# Patient Record
Sex: Female | Born: 1995 | Race: White | Hispanic: No | Marital: Single | Smoking: Never smoker
Health system: Southern US, Community
[De-identification: ages and names within clinical notes are randomized; demographics above are authoritative.]

## PROBLEM LIST (undated history)

## (undated) DIAGNOSIS — Z8619 Personal history of other infectious and parasitic diseases: Secondary | ICD-10-CM

## (undated) DIAGNOSIS — R519 Headache, unspecified: Secondary | ICD-10-CM

## (undated) DIAGNOSIS — Z8669 Personal history of other diseases of the nervous system and sense organs: Secondary | ICD-10-CM

## (undated) DIAGNOSIS — Z8781 Personal history of (healed) traumatic fracture: Secondary | ICD-10-CM

## (undated) DIAGNOSIS — Z111 Encounter for screening for respiratory tuberculosis: Secondary | ICD-10-CM

## (undated) DIAGNOSIS — R51 Headache: Secondary | ICD-10-CM

## (undated) HISTORY — DX: Personal history of (healed) traumatic fracture: Z87.81

## (undated) HISTORY — PX: TYMPANOSTOMY TUBE PLACEMENT: SHX32

## (undated) HISTORY — DX: Personal history of other diseases of the nervous system and sense organs: Z86.69

## (undated) HISTORY — DX: Personal history of other infectious and parasitic diseases: Z86.19

## (undated) HISTORY — DX: Encounter for screening for respiratory tuberculosis: Z11.1

## (undated) HISTORY — PX: WISDOM TOOTH EXTRACTION: SHX21

---
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
## 2016-03-23 ENCOUNTER — Telehealth: Payer: Self-pay | Admitting: *Deleted

## 2016-03-23 NOTE — Telephone Encounter (Signed)
Patient's mother called stating the pt is having dull headaches for the past month or so?  Mother states she feels it is a lack of rest and being on her phone a lot.  I talked with [facility] and informed her of this and she approved to schedule the pt an appt for 6/30 at 3pm.

## 2016-03-27 ENCOUNTER — Ambulatory Visit (INDEPENDENT_AMBULATORY_CARE_PROVIDER_SITE_OTHER): Payer: BLUE CROSS/BLUE SHIELD | Admitting: Internal Medicine

## 2016-03-27 ENCOUNTER — Encounter: Payer: Self-pay | Admitting: Internal Medicine

## 2016-03-27 VITALS — BP 120/80 | Temp 98.4°F | Wt 179.7 lb

## 2016-03-27 DIAGNOSIS — G4452 New daily persistent headache (NDPH): Secondary | ICD-10-CM | POA: Insufficient documentation

## 2016-03-27 DIAGNOSIS — J029 Acute pharyngitis, unspecified: Secondary | ICD-10-CM

## 2016-03-27 DIAGNOSIS — G43009 Migraine without aura, not intractable, without status migrainosus: Secondary | ICD-10-CM | POA: Diagnosis not present

## 2016-03-27 MED ORDER — AMITRIPTYLINE HCL 10 MG PO TABS: 10.0000 mg | ORAL_TABLET | Freq: Every day | ORAL | Status: DC

## 2016-03-27 MED ORDER — SUMATRIPTAN SUCCINATE 100 MG PO TABS: ORAL_TABLET | ORAL | Status: DC

## 2016-03-27 NOTE — Progress Notes (Signed)
Chief Complaint  Patient presents with  . Migraine    Wakes with a headache everyday.  Gets a migraine about 3 x months with vomiting, blurred vision and floaters.  . Headache    HPI: Katelyn Alvarado 20 y.o. comes in today because of continued worsening and persistence of headaches. She states that she has every day.Onset  In am   Dull and then worse  And gets floaters and then take ibu every day and as needed  Migraine.  Tried other options .  She states that she takes Effexor 600 mg of ibuprofen midday or afternoon to help with the headaches. Drink sweet tea most days. She will take over-the-counter Excedrin Migraine. When needed. The headaches often have photophobia but no vomiting. The migraines are more severe where she needs to be in a dark room. Rest helps the daily headaches but they progress as the day goes on. Vision :   No double vision . Sleeping  And in shower.   Migraines 2 x per month  righ eye  Sharp and blurry light and dark isssues  ROS: See pertinent positives and negatives per HPI. For the last 3 days has had a sore throat went to urgent care rapid strep negative thoracic culture is pending. No fever but does have white spots on her tonsils.  Past Medical History  Diagnosis Date  . Hx of varicella     titer confirmed   . Hx of fracture of arm     left   . History of ear infection   . PPD screening test     negative     Family History  Problem Relation Age of Onset  . Hypertension    . Asthma Sister     Social History   Social History  . Marital Status: Single    Spouse Name: N/A  . Number of Children: N/A  . Years of Education: N/A   Social History Main Topics  . Smoking status: Never Smoker   . Smokeless tobacco: Never Used  . Alcohol Use: No  . Drug Use: None  . Sexual Activity: Not Asked   Other Topics Concern  . None   Social History Narrative   Neg tad  See   [facility] under centricity    In  [employer] program to do clinical     No outpatient  prescriptions prior to visit.   No facility-administered medications prior to visit.     EXAM:  BP 120/80 mmHg  Temp(Src) 98.4 F (36.9 C) (Oral)  Wt 179 lb 11.2 oz (81.511 kg)  Body mass index is 30.83 kg/(m^2).  GENERAL: vitals reviewed and listed above, alert, oriented, appears well hydrated and in no acute distress HEENT: atraumatic, conjunctiva  clear, no obvious abnormalities on inspection of external nose and ears OP : no lesion edema Tonsils +2 mildly red with pinpoint exudates bilaterally. Neck supple with minimal adenopathy. NECK: no obvious masses on inspection palpation  LUNGS: clear to auscultation bilaterally, no wheezes, rales or rhonchi, good air movement CV: HRRR, no clubbing cyanosis or  peripheral edema nl cap refill  MS: moves all extremities without noticeable focal  Abnormality NEURO: oriented x 3 CN 3-12 appear intact. No focal muscle weakness or atrophy. DTRs symmetrical. Gait WNL.  Grossly non focal. No tremor or abnormal movement. PSYCH: pleasant and cooperative, no obvious depression or anxiety Initial blood pressure repeated and down to 120/80. BP Readings from Last 3 Encounters:  03/27/16 120/80  05/24/15 120/82  02/08/15 108/72   Wt Readings from Last 3 Encounters:  03/27/16 179 lb 11.2 oz (81.511 kg) (95 %*, Z = 1.62)  05/24/15 171 lb (77.565 kg) (93 %*, Z = 1.50)  02/08/15 166 lb 3.2 oz (75.388 kg) (92 %*, Z = 1.42)   * Growth percentiles are based on CDC 2-20 Years data.    ASSESSMENT AND PLAN:  Discussed the following assessment and plan:  New persistent daily headache - Plan: AMB referral to headache clinic  Migraine without aura and without status migrainosus, not intractable - Plan: AMB referral to headache clinic  Exudative pharyngitis - Under evaluation urgent care strep cxt pending.  -Patient advised to return or notify health care team  if symptoms worsen ,persist or new concerns arise.  Patient Instructions  I want to refer  you to headache specialist Neurologist .    you will be contacted about this .  Limit caffiene  .  Can try Imitrex for the acute migraine  .  There are controller medications such as Topamax or elavil for control of headaches    Daily medications can cause rebound headaches   And you should limit otc dialy medications unless specialist rpescribes this.   Analgesic Rebound Headaches An analgesic rebound headache is a headache that returns after pain medicine (analgesic) that was taken to treat the initial headache wears off. People who suffer from tension, migraine, or cluster headaches are at risk for developing rebound headaches. Any type of primary headache can return as a rebound headache if you regularly take analgesics more than three times a week. If the cycle of rebound headaches continues, they become chronic daily headaches.  CAUSES Analgesics frequently associated with this problem include common over-the-counter medicines like aspirin, ibuprofen, acetaminophen, sinus relief medicines, and other medicines that contain caffeine. Narcotic pain medicines are also a common cause of rebound headaches.  SIGNS AND SYMPTOMS The symptoms of rebound headaches are the same as the symptoms of your initial headache. Symptoms of specific types of headaches include: Tension headache  Pressure around the head.  Dull, aching head pain.  Pain felt over the front and sides of the head.  Tenderness in the muscles of the head, neck and shoulders. Migraine Headache  Pulsing or throbbing pain on one or both sides of the head.  Severe pain that interferes with daily activities.  Pain that is worsened by physical activity.  Nausea, vomiting, or both.  Pain with exposure to bright light, loud noises, or strong smells.  General sensitivity to bright light, loud noises, or strong smells.  Visual changes.  Numbness of one or both arms. Cluster Headaches  Severe pain that begins in or around  one eye or temple.  Redness in the eye on the same side as the pain.  Droopy or swollen eyelid.  One-sided head pain.  Nausea.  Runny nose.  Sweaty, pale facial skin.  Restlessness. DIAGNOSIS  Analgesic rebound headaches are diagnosed by reviewing your medical history. This includes the nature of your initial headaches, as well as the type of pain medicines you have been using to treat your headaches and how often you take them. TREATMENT Discontinuing frequent use of the analgesic medicine will typically reduce the frequency of the rebound episodes. This may initially worsen your headaches but eventually the pain should become more manageable, less frequent, and less severe.  Seeing a headache specialists may helpful. He or she may be able to help you manage your headaches and to make sure there  is not another cause of the headaches. Alternative methods of stress relief such as acupuncture, counseling, biofeedback, and massage may also be helpful. Talk with your health care provider about which alternative treatments might be good for you. HOME CARE INSTRUCTIONS Stopping the regular use of pain medicine can be difficult. Follow your health care provider's instructions carefully. Keep all of your appointments. Avoid triggers that are known to cause your primary headaches. SEEK MEDICAL CARE IF: You continue to experience headaches after following your health care provider's recommended treatments. SEEK IMMEDIATE MEDICAL CARE IF:  You develop new headache pain.  You develop headache pain that is different than what you have experienced in the past.  You develop numbness or tingling in your arms or legs.  You develop changes in your speech or vision. MAKE SURE YOU:  Understand these instructions.  Will watch your child's condition.  Will get help right away if your child is not doing well or gets worse.   This information is not intended to replace advice given to you by your  health care provider. Make sure you discuss any questions you have with your health care provider.   Document Released: 12/05/2003 Document Revised: 10/05/2014 Document Reviewed: 03/30/2013 Elsevier Interactive Patient Education 2016 [place] [employer]. [employer] Cowden M.D.

## 2016-03-27 NOTE — Assessment & Plan Note (Signed)
Occurs apparently 2-3 times a month with photophobia and phonophobia and nausea. Can try Imitrex for these. Headache referral. No alarm findings otherwise.

## 2016-03-27 NOTE — Assessment & Plan Note (Signed)
Concern for progressive headache although worse in midday afternoon consider analgesic rebound headache. Refer to headache neurology. In the meantime can add low-dose amitriptyline as a trial.

## 2016-03-27 NOTE — Patient Instructions (Addendum)
I want to refer you to headache specialist Neurologist .    you will be contacted about this .  Limit caffiene  .  Can try Imitrex for the acute migraine  .  There are controller medications such as Topamax or elavil for control of headaches    Daily medications can cause rebound headaches   And you should limit otc dialy medications unless specialist rpescribes this.   Analgesic Rebound Headaches An analgesic rebound headache is a headache that returns after pain medicine (analgesic) that was taken to treat the initial headache wears off. People who suffer from tension, migraine, or cluster headaches are at risk for developing rebound headaches. Any type of primary headache can return as a rebound headache if you regularly take analgesics more than three times a week. If the cycle of rebound headaches continues, they become chronic daily headaches.  CAUSES Analgesics frequently associated with this problem include common over-the-counter medicines like aspirin, ibuprofen, acetaminophen, sinus relief medicines, and other medicines that contain caffeine. Narcotic pain medicines are also a common cause of rebound headaches.  SIGNS AND SYMPTOMS The symptoms of rebound headaches are the same as the symptoms of your initial headache. Symptoms of specific types of headaches include: Tension headache  Pressure around the head.  Dull, aching head pain.  Pain felt over the front and sides of the head.  Tenderness in the muscles of the head, neck and shoulders. Migraine Headache  Pulsing or throbbing pain on one or both sides of the head.  Severe pain that interferes with daily activities.  Pain that is worsened by physical activity.  Nausea, vomiting, or both.  Pain with exposure to bright light, loud noises, or strong smells.  General sensitivity to bright light, loud noises, or strong smells.  Visual changes.  Numbness of one or both arms. Cluster Headaches  Severe pain that  begins in or around one eye or temple.  Redness in the eye on the same side as the pain.  Droopy or swollen eyelid.  One-sided head pain.  Nausea.  Runny nose.  Sweaty, pale facial skin.  Restlessness. DIAGNOSIS  Analgesic rebound headaches are diagnosed by reviewing your medical history. This includes the nature of your initial headaches, as well as the type of pain medicines you have been using to treat your headaches and how often you take them. TREATMENT Discontinuing frequent use of the analgesic medicine will typically reduce the frequency of the rebound episodes. This may initially worsen your headaches but eventually the pain should become more manageable, less frequent, and less severe.  Seeing a headache specialists may helpful. He or she may be able to help you manage your headaches and to make sure there is not another cause of the headaches. Alternative methods of stress relief such as acupuncture, counseling, biofeedback, and massage may also be helpful. Talk with your health care provider about which alternative treatments might be good for you. HOME CARE INSTRUCTIONS Stopping the regular use of pain medicine can be difficult. Follow your health care provider's instructions carefully. Keep all of your appointments. Avoid triggers that are known to cause your primary headaches. SEEK MEDICAL CARE IF: You continue to experience headaches after following your health care provider's recommended treatments. SEEK IMMEDIATE MEDICAL CARE IF:  You develop new headache pain.  You develop headache pain that is different than what you have experienced in the past.  You develop numbness or tingling in your arms or legs.  You develop changes in your speech or  vision. MAKE SURE YOU:  Understand these instructions.  Will watch your child's condition.  Will get help right away if your child is not doing well or gets worse.   This information is not intended to replace advice  given to you by your health care provider. Make sure you discuss any questions you have with your health care provider.   Document Released: 12/05/2003 Document Revised: 10/05/2014 Document Reviewed: 03/30/2013 Elsevier Interactive Patient Education [employer].

## 2016-03-27 NOTE — Progress Notes (Signed)
Pre visit review using our clinic review tool, if applicable. No additional management support is needed unless otherwise documented below in the visit note. 

## 2016-03-30 ENCOUNTER — Encounter: Payer: Self-pay | Admitting: Internal Medicine

## 2016-03-30 ENCOUNTER — Ambulatory Visit (INDEPENDENT_AMBULATORY_CARE_PROVIDER_SITE_OTHER): Payer: BLUE CROSS/BLUE SHIELD | Admitting: Internal Medicine

## 2016-03-30 VITALS — BP 122/82 | Temp 98.3°F | Wt 180.5 lb

## 2016-03-30 DIAGNOSIS — J029 Acute pharyngitis, unspecified: Secondary | ICD-10-CM

## 2016-03-30 LAB — POCT MONO (EPSTEIN BARR VIRUS): Mono, POC: NEGATIVE

## 2016-03-30 LAB — CBC WITH DIFFERENTIAL/PLATELET
BASOS ABS: 0.1 10*3/uL (ref 0.0–0.1)
BASOS PCT: 0.4 % (ref 0.0–3.0)
EOS PCT: 0.5 % (ref 0.0–5.0)
Eosinophils Absolute: 0.1 10*3/uL (ref 0.0–0.7)
HEMATOCRIT: 40.9 % (ref 36.0–49.0)
Hemoglobin: 13.6 g/dL (ref 12.0–16.0)
LYMPHS PCT: 15.9 % — AB (ref 24.0–48.0)
Lymphs Abs: 2 10*3/uL (ref 0.7–4.0)
MCHC: 33.4 g/dL (ref 31.0–37.0)
MCV: 84.5 fl (ref 78.0–98.0)
MONOS PCT: 5.9 % (ref 3.0–12.0)
Monocytes Absolute: 0.8 10*3/uL (ref 0.1–1.0)
NEUTROS ABS: 9.8 10*3/uL — AB (ref 1.4–7.7)
Neutrophils Relative %: 77.3 % — ABNORMAL HIGH (ref 43.0–71.0)
PLATELETS: 299 10*3/uL (ref 150.0–575.0)
RBC: 4.84 Mil/uL (ref 3.80–5.70)
RDW: 13 % (ref 11.4–15.5)
WBC: 12.7 10*3/uL (ref 4.5–13.5)

## 2016-03-30 MED ORDER — CEPHALEXIN 500 MG PO CAPS: 500.0000 mg | ORAL_CAPSULE | Freq: Two times a day (BID) | ORAL | Status: DC

## 2016-03-30 NOTE — Progress Notes (Signed)
Chief Complaint  Patient presents with  . Sore Throat    Worsening    HPI: Katelyn Alvarado 20 y.o.   1 week of sx .  Noes stuffy   No cough fever  Getting worse throat feels closing .  rs and cultrure reported as negative for strep.  Hurting a bit  Now on ibuprofen for this no rash hurts to swallow No vomiting    ROS: See pertinent positives and negatives per HPI.  Past Medical History  Diagnosis Date  . Hx of varicella     titer confirmed   . Hx of fracture of arm     left   . History of ear infection   . PPD screening test     negative     Family History  Problem Relation Age of Onset  . Hypertension    . Asthma Sister     Social History   Social History  . Marital Status: Single    Spouse Name: N/A  . Number of Children: N/A  . Years of Education: N/A   Social History Main Topics  . Smoking status: Never Smoker   . Smokeless tobacco: Never Used  . Alcohol Use: No  . Drug Use: None  . Sexual Activity: Not Asked   Other Topics Concern  . None   Social History Narrative   Neg tad  See   [facility] under centricity    In  [employer] program to do clinical     Outpatient Prescriptions Prior to Visit  Medication Sig Dispense Refill  . amitriptyline (ELAVIL) 10 MG tablet Take 1 tablet (10 mg total) by mouth at bedtime. may increase to 20 mg at night after 1 week for headache suppression. 60 tablet 0  . SUMAtriptan (IMITREX) 100 MG tablet Take 1 at onset of migraine may repeat in 2 hours    No more than 4 per week . 9 tablet 1  . aspirin-acetaminophen-caffeine (EXCEDRIN MIGRAINE) [id number] MG tablet Take by mouth every 6 (six) hours as needed for headache.    . IBUPROFEN PO Take by mouth.     No facility-administered medications prior to visit.     EXAM:  BP 122/82 mmHg  Temp(Src) 98.3 F (36.8 C) (Oral)  Wt 180 lb 8 oz (81.874 kg)  Body mass index is 30.97 kg/(m^2).  GENERAL: vitals reviewed and listed above, alert, oriented, appears well hydrated and in  no acute distress speech slightly mulffled  HEENT: atraumatic, conjunctiva  clear, no obvious abnormalities on inspection of external nose and ears OP : tonsil 2=3+  esucate confluent left  NECK: no obvious masses on inspection tender ac nodes shoddy pc nodes  LUNGS: clear to auscultation bilaterally, no wheezes, rales or rhonchi, good air movement CV: HRRR, no clubbing cyanosis or  peripheral edema nl cap refill  Abdomen:  Sof,t normal bowel sounds without hepatosplenomegaly, no guarding rebound or masses no CVA tenderness MS: moves all extremities without noticeable focal  abnormality PSYCH: pleasant and cooperative, no obvious depression or anxiety  ASSESSMENT AND PLAN:  Discussed the following assessment and plan:  Exudative pharyngitis - progrssing to tonsillitis  worse  neg strep cx ?  if could be non group a monosp neg blood tests  empritic antibioptic and fu depending - Plan: POCT Mono (Epstein Barr Virus), CBC with Differential/Platelet, Epstein-Barr virus VCA antibody panel   Expectant management.  -Patient advised to return or notify health care team  if symptoms worsen ,persist or new concerns  arise.  Patient Instructions  Rapid  mono is negative   Could still be mono which is a prolonged self resolving infection   but will add antibiotic empirically because of  The  Worsening after  1 week  .   Waiting on other   Labs  Expect improvement in the next 2-3 days.  Can get [place] progress fu on my chart .      Standley Brooking. Katelyn Alvarado M.D.

## 2016-03-30 NOTE — Progress Notes (Signed)
Pre visit review using our clinic review tool, if applicable. No additional management support is needed unless otherwise documented below in the visit note. 

## 2016-03-30 NOTE — Patient Instructions (Addendum)
Rapid  mono is negative   Could still be mono which is a prolonged self resolving infection   but will add antibiotic empirically because of  The  Worsening after  1 week  .   Waiting on other   Labs  Expect improvement in the next 2-3 days.  Can get [place] progress fu on my chart .

## 2016-04-01 LAB — EPSTEIN-BARR VIRUS VCA ANTIBODY PANEL
EBV NA IGG: 305 U/mL — AB
EBV VCA IGG: 117 U/mL — AB

## 2016-04-19 ENCOUNTER — Encounter: Payer: Self-pay | Admitting: Internal Medicine

## 2016-04-20 NOTE — Telephone Encounter (Signed)
Please  see how she does over the next 2 days   And let me recheck her throat if    Not better or progressing  Before the weekend

## 2016-04-21 NOTE — Telephone Encounter (Signed)
Ok to come in Thursday Friday   As a work in [place]  ( Alligator results not in [place] ?   Pl;ase have lab put them in .)

## 2016-04-24 ENCOUNTER — Ambulatory Visit: Payer: BLUE CROSS/BLUE SHIELD | Admitting: Internal Medicine

## 2016-05-13 DIAGNOSIS — H6121 Impacted cerumen, right ear: Secondary | ICD-10-CM | POA: Insufficient documentation

## 2016-05-13 DIAGNOSIS — H7291 Unspecified perforation of tympanic membrane, right ear: Secondary | ICD-10-CM | POA: Insufficient documentation

## 2016-06-18 ENCOUNTER — Ambulatory Visit: Payer: Self-pay | Admitting: Otolaryngology

## 2016-06-22 ENCOUNTER — Ambulatory Visit: Payer: Self-pay | Admitting: Otolaryngology
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
## 2016-07-07 ENCOUNTER — Encounter (HOSPITAL_COMMUNITY): Payer: Self-pay

## 2016-07-07 NOTE — Pre-Procedure Instructions (Signed)
JOURNEII SHOLAR  07/07/2016      [id number] - [place], [place] - [address] [place] [address] Phone: [phone] Fax: [phone]    Your procedure is scheduled on Wednesday, October 18th, 2017.  Report to [facility] Admitting at 6:30 A.M.   Call this number if you have problems the morning of surgery:  [phone]   Remember:  Do not eat food or drink liquids after midnight.   Take these medicines the morning of surgery with A SIP OF WATER: Baclofen (Lioresal) if needed.  Stop taking: Aspirin, NSAIDS, Aleve, Naproxen, Ibuprofen, Advil, Motrin, BC's, Goody's, Fish oil, all herbal medications, and all vitamins.    Do not wear jewelry, make-up or nail polish.  Do not wear lotions, powders, or perfumes, or deoderant.  Do not shave 48 hours prior to surgery.    Do not bring valuables to the hospital.  [facility] is not responsible for any belongings or valuables.  Contacts, dentures or bridgework may not be worn into surgery.  Leave your suitcase in the car.  After surgery it may be brought to your room.  For patients admitted to the hospital, discharge time will be determined by your treatment team.  Patients discharged the day of surgery will not be allowed to drive home.   Special instructions:  Preparing for Surgery.    Please read over the following fact sheets that you were given.   [facility]- Preparing For Surgery  Before surgery, you can play an important role. Because skin is not sterile, your skin needs to be as free of germs as possible. You can reduce the number of germs on your skin by washing with CHG (chlorahexidine gluconate) Soap before surgery.  CHG is an antiseptic cleaner which kills germs and bonds with the skin to continue killing germs even after washing.  Please do not use if you have an allergy to CHG or antibacterial soaps. If your skin becomes reddened/irritated stop using  the CHG.  Do not shave (including legs and underarms) for at least 48 hours prior to first CHG shower. It is OK to shave your face.  Please follow these instructions carefully.   1. Shower the NIGHT BEFORE SURGERY and the MORNING OF SURGERY with CHG.   2. If you chose to wash your hair, wash your hair first as usual with your normal shampoo.  3. After you shampoo, rinse your hair and body thoroughly to remove the shampoo.  4. Use CHG as you would any other liquid soap. You can apply CHG directly to the skin and wash gently with a scrungie or a clean washcloth.   5. Apply the CHG Soap to your body ONLY FROM THE NECK DOWN.  Do not use on open wounds or open sores. Avoid contact with your eyes, ears, mouth and genitals (private parts). Wash genitals (private parts) with your normal soap.  6. Wash thoroughly, paying special attention to the area where your surgery will be performed.  7. Thoroughly rinse your body with warm water from the neck down.  8. DO NOT shower/wash with your normal soap after using and rinsing off the CHG Soap.  9. Pat yourself dry with a CLEAN TOWEL.   10. Wear CLEAN PAJAMAS   11. Place CLEAN SHEETS on your bed the night of your first shower and DO NOT SLEEP WITH PETS.  Day of Surgery: Do not apply any deodorants/lotions. Please wear  clean clothes to the hospital/surgery center.

## 2016-07-08 ENCOUNTER — Encounter (HOSPITAL_COMMUNITY)
Admission: RE | Admit: 2016-07-08 | Discharge: 2016-07-08 | Disposition: A | Discharge status: Home / Self Care | Payer: BLUE CROSS/BLUE SHIELD | Source: Ambulatory Visit | Attending: Otolaryngology | Admitting: Otolaryngology

## 2016-07-08 ENCOUNTER — Encounter (HOSPITAL_COMMUNITY): Payer: Self-pay

## 2016-07-08 DIAGNOSIS — H7290 Unspecified perforation of tympanic membrane, unspecified ear: Secondary | ICD-10-CM | POA: Diagnosis present

## 2016-07-08 HISTORY — DX: Headache: R51

## 2016-07-08 HISTORY — DX: Headache, unspecified: R51.9

## 2016-07-08 LAB — CBC
HEMATOCRIT: 38.8 % (ref 36.0–46.0)
Hemoglobin: 13.2 g/dL (ref 12.0–15.0)
MCH: 28 pg (ref 26.0–34.0)
MCHC: 34 g/dL (ref 30.0–36.0)
MCV: 82.4 fL (ref 78.0–100.0)
PLATELETS: 321 10*3/uL (ref 150–400)
RBC: 4.71 MIL/uL (ref 3.87–5.11)
RDW: 12.6 % (ref 11.5–15.5)
WBC: 11 10*3/uL — AB (ref 4.0–10.5)

## 2016-07-08 LAB — HCG, SERUM, QUALITATIVE: Preg, Serum: NEGATIVE

## 2016-07-15 ENCOUNTER — Ambulatory Visit (HOSPITAL_COMMUNITY)
Admission: RE | Admit: 2016-07-15 | Discharge: 2016-07-15 | Disposition: A | Discharge status: Home / Self Care | Payer: BLUE CROSS/BLUE SHIELD | Source: Ambulatory Visit | Attending: Otolaryngology | Admitting: Otolaryngology

## 2016-07-15 ENCOUNTER — Encounter (HOSPITAL_COMMUNITY): Payer: Self-pay | Admitting: Anesthesiology

## 2016-07-15 ENCOUNTER — Encounter (HOSPITAL_COMMUNITY)
Admission: RE | Disposition: A | Discharge status: Home / Self Care | Payer: Self-pay | Source: Ambulatory Visit | Attending: Otolaryngology

## 2016-07-15 ENCOUNTER — Ambulatory Visit (HOSPITAL_COMMUNITY): Payer: BLUE CROSS/BLUE SHIELD | Admitting: Anesthesiology

## 2016-07-15 DIAGNOSIS — H7291 Unspecified perforation of tympanic membrane, right ear: Secondary | ICD-10-CM | POA: Diagnosis present

## 2016-07-15 DIAGNOSIS — H722X1 Other marginal perforations of tympanic membrane, right ear: Secondary | ICD-10-CM | POA: Insufficient documentation

## 2016-07-15 HISTORY — PX: TYMPANOPLASTY: SHX33

## 2016-07-15 SURGERY — TYMPANOPLASTY
Anesthesia: General | Site: Ear | Laterality: Right

## 2016-07-15 MED ORDER — CIPROFLOXACIN-DEXAMETHASONE 0.3-0.1 % OT SUSP
OTIC | Status: AC
  Filled 2016-07-15: qty 7.5

## 2016-07-15 MED ORDER — FENTANYL CITRATE (PF) 100 MCG/2ML IJ SOLN
INTRAMUSCULAR | Status: DC | PRN
  Administered 2016-07-15 (×3): 50 ug via INTRAVENOUS

## 2016-07-15 MED ORDER — LIDOCAINE HCL 4 % EX SOLN
CUTANEOUS | Status: DC | PRN
  Administered 2016-07-15: 3 mL via TOPICAL

## 2016-07-15 MED ORDER — PROPOFOL 10 MG/ML IV BOLUS
INTRAVENOUS | Status: DC | PRN
  Administered 2016-07-15: 160 mg via INTRAVENOUS

## 2016-07-15 MED ORDER — LIDOCAINE-EPINEPHRINE 1 %-1:100000 IJ SOLN
INTRAMUSCULAR | Status: AC
  Filled 2016-07-15: qty 1

## 2016-07-15 MED ORDER — PROMETHAZINE HCL 25 MG RE SUPP: 25.0000 mg | Freq: Four times a day (QID) | RECTAL | 1 refills | Status: DC | PRN

## 2016-07-15 MED ORDER — 0.9 % SODIUM CHLORIDE (POUR BTL) OPTIME
TOPICAL | Status: DC | PRN
  Administered 2016-07-15: 1000 mL

## 2016-07-15 MED ORDER — BACITRACIN ZINC 500 UNIT/GM EX OINT
TOPICAL_OINTMENT | CUTANEOUS | Status: AC
  Filled 2016-07-15: qty 28.35

## 2016-07-15 MED ORDER — PROPOFOL 10 MG/ML IV BOLUS
INTRAVENOUS | Status: AC
  Filled 2016-07-15: qty 20

## 2016-07-15 MED ORDER — LIDOCAINE-EPINEPHRINE 1 %-1:100000 IJ SOLN
INTRAMUSCULAR | Status: DC | PRN
  Administered 2016-07-15: 20 mL

## 2016-07-15 MED ORDER — DEXAMETHASONE SODIUM PHOSPHATE 10 MG/ML IJ SOLN
INTRAMUSCULAR | Status: DC | PRN
  Administered 2016-07-15: 10 mg via INTRAVENOUS

## 2016-07-15 MED ORDER — CIPROFLOXACIN-DEXAMETHASONE 0.3-0.1 % OT SUSP
OTIC | Status: DC | PRN
  Administered 2016-07-15: 4 [drp] via OTIC

## 2016-07-15 MED ORDER — HEMOSTATIC AGENTS (NO CHARGE) OPTIME
TOPICAL | Status: DC | PRN
  Administered 2016-07-15: 1 via TOPICAL

## 2016-07-15 MED ORDER — MIDAZOLAM HCL 5 MG/5ML IJ SOLN
INTRAMUSCULAR | Status: DC | PRN
  Administered 2016-07-15: 2 mg via INTRAVENOUS

## 2016-07-15 MED ORDER — MIDAZOLAM HCL 2 MG/2ML IJ SOLN
INTRAMUSCULAR | Status: AC
  Filled 2016-07-15: qty 2

## 2016-07-15 MED ORDER — LIDOCAINE HCL (CARDIAC) 20 MG/ML IV SOLN
INTRAVENOUS | Status: DC | PRN
  Administered 2016-07-15: 60 mg via INTRAVENOUS

## 2016-07-15 MED ORDER — CIPROFLOXACIN-DEXAMETHASONE 0.3-0.1 % OT SUSP: 3.0000 [drp] | Freq: Three times a day (TID) | OTIC | 2 refills | Status: DC

## 2016-07-15 MED ORDER — HYDROCODONE-ACETAMINOPHEN 7.5-325 MG PO TABS: 1.0000 | ORAL_TABLET | Freq: Four times a day (QID) | ORAL | 0 refills | Status: DC | PRN

## 2016-07-15 MED ORDER — FENTANYL CITRATE (PF) 100 MCG/2ML IJ SOLN
INTRAMUSCULAR | Status: AC
  Filled 2016-07-15: qty 4

## 2016-07-15 MED ORDER — PROMETHAZINE HCL 25 MG/ML IJ SOLN: 6.2500 mg | INTRAMUSCULAR | Status: DC | PRN

## 2016-07-15 MED ORDER — BACITRACIN ZINC 500 UNIT/GM EX OINT
TOPICAL_OINTMENT | CUTANEOUS | Status: DC | PRN
  Administered 2016-07-15: 1 via TOPICAL

## 2016-07-15 MED ORDER — SUCCINYLCHOLINE CHLORIDE 20 MG/ML IJ SOLN
INTRAMUSCULAR | Status: DC | PRN
  Administered 2016-07-15: 120 mg via INTRAVENOUS

## 2016-07-15 MED ORDER — HYDROMORPHONE HCL 2 MG/ML IJ SOLN: 0.2500 mg | INTRAMUSCULAR | Status: DC | PRN

## 2016-07-15 MED ORDER — LACTATED RINGERS IV SOLN
INTRAVENOUS | Status: DC | PRN
  Administered 2016-07-15: 08:00:00 via INTRAVENOUS

## 2016-07-15 MED ORDER — ONDANSETRON HCL 4 MG/2ML IJ SOLN
INTRAMUSCULAR | Status: DC | PRN
  Administered 2016-07-15: 4 mg via INTRAVENOUS

## 2016-07-15 SURGICAL SUPPLY — 61 items
ADH SKN CLS APL DERMABOND .7 (GAUZE/BANDAGES/DRESSINGS) ×1
APL SKNCLS STERI-STRIP NONHPOA (GAUZE/BANDAGES/DRESSINGS)
ATTRACTOMAT 16X20 MAGNETIC DRP (DRAPES) IMPLANT
BALL CTTN LRG ABS STRL LF (GAUZE/BANDAGES/DRESSINGS)
BANDAGE GAUZE 4  KLING STR (GAUZE/BANDAGES/DRESSINGS) IMPLANT
BENZOIN TINCTURE PRP APPL 2/3 (GAUZE/BANDAGES/DRESSINGS) IMPLANT
BLADE SURG 15 STRL LF DISP TIS (BLADE) IMPLANT
BLADE SURG 15 STRL SS (BLADE)
BNDG CONFORM 3 STRL LF (GAUZE/BANDAGES/DRESSINGS) IMPLANT
CANISTER SUCTION 2500CC (MISCELLANEOUS) ×3 IMPLANT
CLEANER TIP ELECTROSURG 2X2 (MISCELLANEOUS) ×3 IMPLANT
CLOSURE WOUND 1/2 X4 (GAUZE/BANDAGES/DRESSINGS)
COTTONBALL LRG STERILE PKG (GAUZE/BANDAGES/DRESSINGS) IMPLANT
COVER SURGICAL LIGHT HANDLE (MISCELLANEOUS) ×3 IMPLANT
DECANTER SPIKE VIAL GLASS SM (MISCELLANEOUS) ×2 IMPLANT
DERMABOND ADVANCED (GAUZE/BANDAGES/DRESSINGS) ×2
DERMABOND ADVANCED .7 DNX12 (GAUZE/BANDAGES/DRESSINGS) ×1 IMPLANT
DRAPE EENT ADH APERT 31X51 STR (DRAPES) ×3 IMPLANT
DRAPE MICROSCOPE LEICA 54X105 (DRAPE) ×3 IMPLANT
DRAPE PROXIMA HALF (DRAPES) IMPLANT
DRESSING ADAPTIC 1/2  N-ADH (PACKING) IMPLANT
DRESSING TELFA 8X10 (GAUZE/BANDAGES/DRESSINGS) IMPLANT
DRSG GLASSCOCK MASTOID ADT (GAUZE/BANDAGES/DRESSINGS) ×2 IMPLANT
DRSG GLASSCOCK MASTOID PED (GAUZE/BANDAGES/DRESSINGS) IMPLANT
ELECT COATED BLADE 2.86 ST (ELECTRODE) ×3 IMPLANT
ELECT REM PT RETURN 9FT ADLT (ELECTROSURGICAL) ×3
ELECTRODE REM PT RTRN 9FT ADLT (ELECTROSURGICAL) ×1 IMPLANT
GAUZE IODOFORM PACK 1/2 7832 (GAUZE/BANDAGES/DRESSINGS) IMPLANT
GLOVE BIO SURGEON STRL SZ8 (GLOVE) ×2 IMPLANT
GLOVE BIOGEL PI IND STRL 6.5 (GLOVE) IMPLANT
GLOVE BIOGEL PI IND STRL 8.5 (GLOVE) IMPLANT
GLOVE BIOGEL PI INDICATOR 6.5 (GLOVE) ×4
GLOVE BIOGEL PI INDICATOR 8.5 (GLOVE) ×2
GLOVE ECLIPSE 6.0 STRL STRAW (GLOVE) ×2 IMPLANT
GLOVE ECLIPSE 7.5 STRL STRAW (GLOVE) ×3 IMPLANT
GOWN STRL REUS W/ TWL LRG LVL3 (GOWN DISPOSABLE) ×2 IMPLANT
GOWN STRL REUS W/ TWL XL LVL3 (GOWN DISPOSABLE) ×1 IMPLANT
GOWN STRL REUS W/TWL LRG LVL3 (GOWN DISPOSABLE) ×3
GOWN STRL REUS W/TWL XL LVL3 (GOWN DISPOSABLE) ×6
HEMOSTAT SURGICEL .5X2 ABSORB (HEMOSTASIS) IMPLANT
IV CATH 18G X1.75 CATHLON (IV SOLUTION) ×3 IMPLANT
KIT BASIN OR (CUSTOM PROCEDURE TRAY) ×3 IMPLANT
KIT ROOM TURNOVER OR (KITS) ×3 IMPLANT
NEEDLE 27GAX1X1/2 (NEEDLE) ×3 IMPLANT
NS IRRIG 1000ML POUR BTL (IV SOLUTION) ×3 IMPLANT
PAD ARMBOARD 7.5X6 YLW CONV (MISCELLANEOUS) ×6 IMPLANT
PENCIL FOOT CONTROL (ELECTRODE) ×3 IMPLANT
PUNCH BIOPSY 4MM (MISCELLANEOUS) ×3
PUNCH BIOPSY DISP 4 (MISCELLANEOUS) IMPLANT
SPECIMEN JAR SMALL (MISCELLANEOUS) IMPLANT
SPONGE SURGIFOAM ABS GEL 12-7 (HEMOSTASIS) ×2 IMPLANT
STRIP CLOSURE SKIN 1/2X4 (GAUZE/BANDAGES/DRESSINGS) IMPLANT
SUT CHROMIC 3 0 PS 2 (SUTURE) ×2 IMPLANT
SUT CHROMIC 4 0 RB 1X27 (SUTURE) IMPLANT
SUT VICRYL 4-0 PS2 18IN ABS (SUTURE) IMPLANT
SYR 20ML ECCENTRIC (SYRINGE) IMPLANT
TOWEL OR 17X24 6PK STRL BLUE (TOWEL DISPOSABLE) ×3 IMPLANT
TRAY ENT MC OR (CUSTOM PROCEDURE TRAY) ×3 IMPLANT
TUBING EXTENTION W/L.L. (IV SETS) ×2 IMPLANT
WATER STERILE IRR 1000ML POUR (IV SOLUTION) ×1 IMPLANT
WIPE INSTRUMENT VISIWIPE 73X73 (MISCELLANEOUS) ×3 IMPLANT

## 2016-07-15 NOTE — Discharge Instructions (Signed)
You may remove the dressing tomorrow. Undue the Velcro strap. Remove the little adhesive pad from the forehead. The entire dressing then comes off. There is a thin pad behind the ear that can be removed. Remove the cotton ball from the ear, place 3 of the drops and then replace with a fresh cotton ball. Do that 3 times daily.  For the next 3 weeks:   Avoid nose-blowing, make sure to open mouth when sneezing, avoid strenuous activity, no water in the ear.

## 2016-07-15 NOTE — Addendum Note (Signed)
Addendum  created 07/15/16 1630 by Laretta Alstrom, CRNA   Anesthesia Event edited

## 2016-07-15 NOTE — Anesthesia Preprocedure Evaluation (Signed)
Anesthesia Evaluation  Patient identified by MRN, date of birth, ID band Patient awake    Reviewed: Allergy & Precautions, NPO status , Patient's Chart, lab work & pertinent test results  History of Anesthesia Complications (+) Emergence Delirium  Airway Mallampati: I   Neck ROM: Full    Dental  (+) Teeth Intact   Pulmonary neg pulmonary ROS,    breath sounds clear to auscultation       Cardiovascular negative cardio ROS   Rhythm:Regular Rate:Normal     Neuro/Psych  Headaches,    GI/Hepatic negative GI ROS, Neg liver ROS,   Endo/Other  negative endocrine ROS  Renal/GU negative Renal ROS     Musculoskeletal   Abdominal   Peds negative pediatric ROS (+)  Hematology negative hematology ROS (+)   Anesthesia Other Findings   Reproductive/Obstetrics                             Anesthesia Physical Anesthesia Plan  ASA: I  Anesthesia Plan: General   Post-op Pain Management:    Induction: Intravenous  Airway Management Planned: Oral ETT  Additional Equipment:   Intra-op Plan:   Post-operative Plan: Extubation in OR  Informed Consent:   Dental advisory given  Plan Discussed with:   Anesthesia Plan Comments:         Anesthesia Quick Evaluation
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
## 2016-07-15 NOTE — Interval H&P Note (Signed)
History and Physical Interval Note:  07/15/2016 8:23 AM  Katelyn Alvarado  has presented today for surgery, with the diagnosis of Right Perforated Tympanic Membrane  The various methods of treatment have been discussed with the patient and family. After consideration of risks, benefits and other options for treatment, the patient has consented to  Procedure(s): TYMPANOPLASTY (Right) as a surgical intervention .  The patient's history has been reviewed, patient examined, no change in status, stable for surgery.  I have reviewed the patient's chart and labs.  Questions were answered to the patient's satisfaction.     Hanny Elsberry

## 2016-07-15 NOTE — Anesthesia Postprocedure Evaluation (Signed)
Anesthesia Post Note  Patient: Katelyn Alvarado  Procedure(s) Performed: Procedure(s) (LRB): TYMPANOPLASTY (Right)  Patient location during evaluation: PACU Anesthesia Type: General Level of consciousness: awake and alert Pain management: pain level controlled Vital Signs Assessment: post-procedure vital signs reviewed and stable Respiratory status: spontaneous breathing, nonlabored ventilation, respiratory function stable and patient connected to nasal cannula oxygen Cardiovascular status: blood pressure returned to baseline and stable Postop Assessment: no signs of nausea or vomiting Anesthetic complications: no    Last Vitals:  Vitals:   07/15/16 1030 07/15/16 1039  BP:  (!) 146/86  Pulse: 93 100  Resp: 15 16  Temp: 36.7 C     Last Pain:  Vitals:   07/15/16 1039  TempSrc:   PainSc: 0-No pain                 Joah Patlan,JAMES TERRILL

## 2016-07-15 NOTE — Op Note (Signed)
OPERATIVE REPORT  DATE OF SURGERY: 07/15/2016  PATIENT:  Katelyn Alvarado,  20 y.o. female  PRE-OPERATIVE DIAGNOSIS:  Right Perforated Tympanic Membrane  POST-OPERATIVE DIAGNOSIS:  Right Perforated Tympanic Membrane  PROCEDURE:  Procedure(s): TYMPANOPLASTY  SURGEON:  Beckie Salts, MD  ASSISTANTS: None  ANESTHESIA:   General   EBL:  20 ml  DRAINS: None  LOCAL MEDICATIONS USED:  1% Xylocaine with epinephrine  SPECIMEN:  none  COUNTS:  Correct  PROCEDURE DETAILS: The patient was taken to the operating room and placed on the operating table in the supine position. Following induction of general endotracheal anesthesia, the right ear was prepped and draped in a standard fashion. Her anesthetic was infiltrated into 4 quadrants of the external auditory canal and the postauricular sulcus superiorly with infiltration of the posterior canal wall skin and vascular strip area. Operating microscope was draped and was used throughout the case.  Sickle knife was used to create radial incisions in the ear canal at 4:00 and 7:00. The perforation was identified in the posterior inferior quadrant, clean edges, healthy middle ear space, and the perforation was marginal. The round knife was used to create a tympanomeatal flap. The edges of the perforation were freshened using a sharp pick and cup forceps. The temporal meatal flap was then elevated and brought forward exposing the middle ear. The chorda tympani nerve was identified and preserved. The posterior edges were cleaned off using cup forceps as well. A small round knife was used to elevate off the annulus inferiorly along the edges of the perforation. The middle ear was packed with saline soaked Gelfoam. The ossicular chain was intact and mobile. There is no evidence of epithelium in the middle ear.  The postauricular incision was created superiorly and a graft was harvested of superficial loose area of her tissue. This was pressed and dried on  the back table. The incision was reapproximated with running subcutaneous suture, 3-0 chromic. Dermabond was used on the skin.  Once the graft was adequately dried it was trimmed and then placed in an underlay technique. All of the perforation edges were laying nicely on the graft. The temporal meatal flap was bisected and the flap was then placed back onto the posterior aspect of the graft. Additional Gelfoam packing was not needed in the middle ear. The ear canal was packed with Ciprodex-soaked Gelfoam. A cotton ball with bacitracin was placed at the meatus. The Glasscock dressing was applied. Patient was awakened extubated and transferred to recovery in stable condition.    PATIENT DISPOSITION:  To PACU, stable

## 2016-07-15 NOTE — Transfer of Care (Signed)
Immediate Anesthesia Transfer of Care Note  Patient: Katelyn Alvarado  Procedure(s) Performed: Procedure(s): TYMPANOPLASTY (Right)  Patient Location: PACU  Anesthesia Type:General  Level of Consciousness: awake, alert , oriented and patient cooperative  Airway & Oxygen Therapy: Patient Spontanous Breathing  Post-op Assessment: Report given to RN and Post -op Vital signs reviewed and stable  Post vital signs: Reviewed and stable  Last Vitals:  Vitals:   07/15/16 0804  BP: 129/71  Resp: 20  Temp: 36.8 C    Last Pain:  Vitals:   07/15/16 0804  TempSrc: Oral  PainSc:          Complications: No apparent anesthesia complications

## 2016-07-16 ENCOUNTER — Encounter (HOSPITAL_COMMUNITY): Payer: Self-pay | Admitting: Otolaryngology

## 2016-08-07 ENCOUNTER — Telehealth: Payer: BLUE CROSS/BLUE SHIELD | Admitting: Family

## 2016-08-07 DIAGNOSIS — B9689 Other specified bacterial agents as the cause of diseases classified elsewhere: Secondary | ICD-10-CM

## 2016-08-07 DIAGNOSIS — N760 Acute vaginitis: Secondary | ICD-10-CM

## 2016-08-07 MED ORDER — METRONIDAZOLE 500 MG PO TABS: 500.0000 mg | ORAL_TABLET | Freq: Two times a day (BID) | ORAL | 0 refills | Status: DC

## 2016-08-07 NOTE — Progress Notes (Signed)

## 2016-09-01 ENCOUNTER — Ambulatory Visit: Payer: Self-pay | Admitting: Internal Medicine

## 2016-10-02 ENCOUNTER — Encounter: Payer: Self-pay | Admitting: Internal Medicine

## 2016-10-05 NOTE — Telephone Encounter (Signed)
Ok to send in diflucan 150  1 po x 1  If ongoing  Needs evaluation  Ov  or a [email or web address].ov

## 2016-10-06 MED ORDER — FLUCONAZOLE 150 MG PO TABS: 150.0000 mg | ORAL_TABLET | Freq: Once | ORAL | 0 refills | Status: AC

## 2016-10-19 ENCOUNTER — Other Ambulatory Visit: Payer: Self-pay | Admitting: Internal Medicine

## 2016-10-28 NOTE — Telephone Encounter (Signed)
Denied request.  Message sent to the pharmacy.  Pt needs to call the office.

## 2017-02-12 DIAGNOSIS — E669 Obesity, unspecified: Secondary | ICD-10-CM | POA: Insufficient documentation

## 2017-04-21 ENCOUNTER — Telehealth: Payer: BLUE CROSS/BLUE SHIELD | Admitting: Nurse Practitioner

## 2017-04-21 DIAGNOSIS — J029 Acute pharyngitis, unspecified: Secondary | ICD-10-CM

## 2017-04-21 MED ORDER — CEPHALEXIN 500 MG PO CAPS: 500.0000 mg | ORAL_CAPSULE | Freq: Two times a day (BID) | ORAL | 0 refills | Status: DC

## 2017-04-21 NOTE — Progress Notes (Signed)
We are sorry that you are not feeling well.  Here is how we plan to help!  Based on what you have shared with me it looks like you have strep pharyngitis.  Strep pharyngitis is inflammation and infection in the back of the throat.  This is an infection caused by bacteria and is treated with antibiotics. I have prescribed cefalexin 500mg  one tablet twice daily with food, for 7 days. You may use an oral throat lozenges as needed. Strep infections are not as easily transmitted as other respiratory infection, however we still recommend that you avoid close contact with loved ones, especially the very young and elderly.  Remember to wash your hands thoroughly throughout the day as this is the number one way to prevent the spread of infection!  Home Care:  Only take medications as instructed by your medical team.  Complete the entire course of an antibiotic.  Do not take these medications with alcohol.  A steam or ultrasonic humidifier can help congestion.  You can place a towel over your head and breathe in the steam from hot water coming from a faucet.  Avoid close contacts especially the very young and the elderly.  Cover your mouth when you cough or sneeze.  Always remember to wash your hands.  Get Help Right Away If:  You develop worsening fever or sinus pain.  You develop a severe head ache or visual changes.  Your symptoms persist after you have completed your treatment plan.  Make sure you  Understand these instructions.  Will watch your condition.  Will get help right away if you are not doing well or get worse.  Your e-visit answers were reviewed by a board certified advanced clinical practitioner to complete your personal care plan.  Depending on the condition, your plan could have included both over the counter or prescription medications.  If there is a problem please reply  once you have received a response from your provider.  Your safety is important to [place].  If you  have drug allergies check your prescription carefully.    You can use MyChart to ask questions about today's visit, request a non-urgent call back, or ask for a work or school excuse for 24 hours related to this e-Visit. If it has been greater than 24 hours you will need to follow up with your provider, or enter a new e-Visit to address those concerns.  You will get an e-mail in the next two days asking about your experience.  I hope that your e-visit has been valuable and will speed your recovery. Thank you for using e-visits.

## 2017-05-05 DIAGNOSIS — D691 Qualitative platelet defects: Secondary | ICD-10-CM | POA: Insufficient documentation

## 2017-05-05 DIAGNOSIS — R632 Polyphagia: Secondary | ICD-10-CM | POA: Insufficient documentation

## 2017-05-05 DIAGNOSIS — R7989 Other specified abnormal findings of blood chemistry: Secondary | ICD-10-CM | POA: Insufficient documentation

## 2017-06-14 DIAGNOSIS — F430 Acute stress reaction: Secondary | ICD-10-CM | POA: Insufficient documentation

## 2017-06-18 ENCOUNTER — Encounter: Payer: Self-pay | Admitting: Internal Medicine

## 2018-06-20 ENCOUNTER — Encounter: Payer: Self-pay | Admitting: Family Medicine

## 2018-11-08 ENCOUNTER — Encounter: Payer: Self-pay | Admitting: Podiatry

## 2018-11-08 ENCOUNTER — Ambulatory Visit: Payer: BLUE CROSS/BLUE SHIELD | Admitting: Podiatry

## 2018-11-08 VITALS — BP 150/97 | HR 120 | Resp 16

## 2018-11-08 DIAGNOSIS — B070 Plantar wart: Secondary | ICD-10-CM | POA: Diagnosis not present

## 2018-11-08 MED ORDER — FLUOROURACIL 5 % EX CREA: TOPICAL_CREAM | Freq: Two times a day (BID) | CUTANEOUS | 1 refills | Status: DC

## 2018-11-09 NOTE — Progress Notes (Signed)
  Subjective:  Patient ID: Katelyn Alvarado, female    DOB: Oct 01, 1995,  MRN: [id number] HPI Chief Complaint  Patient presents with  . Foot Pain    Plantar forefoot left - tiny, callused lesion x few weeks, tried covering with duct tape, starting to become sore  . New Patient (Initial Visit)    23 y.o. female presents with the above complaint.   ROS: Denies fever chills nausea vomiting muscle aches pains calf pain back pain chest pain shortness of breath.  Past Medical History:  Diagnosis Date  . Headache    migaines  . History of ear infection   . Hx of fracture of arm    left   . Hx of varicella    titer confirmed   . PPD screening test    negative    Past Surgical History:  Procedure Laterality Date  . TYMPANOPLASTY Right 07/15/2016   Procedure: TYMPANOPLASTY;  Surgeon: Izora Gala, MD;  Location: [facility];  Service: ENT;  Laterality: Right;  . TYMPANOSTOMY TUBE PLACEMENT    . WISDOM TOOTH EXTRACTION      Current Outpatient Medications:  .  norethindrone (MICRONOR,CAMILA,ERRIN) 0.35 MG tablet, Take by mouth., Disp: , Rfl:  .  fluorouracil (EFUDEX) 5 % cream, Apply topically 2 (two) times daily., Disp: 40 g, Rfl: 1 .  QSYMIA 3.75-23 MG CP24, Take 1 capsule by mouth daily., Disp: , Rfl:  .  solifenacin (VESICARE) 5 MG tablet, , Disp: , Rfl:   Allergies  Allergen Reactions  . Sumatriptan Swelling   Review of Systems Objective:   Vitals:   11/08/18 1439  BP: (!) 150/97  Pulse: (!) 120  Resp: 16    General: Well developed, nourished, in no acute distress, alert and oriented x3   Dermatological: Skin is warm, dry and supple bilateral. Nails x 10 are well maintained; remaining integument appears unremarkable at this time. There are no open sores, no preulcerative lesions, no rash or signs of infection present.  Very small verrucoid lesion between the first and second toes of the left foot just on the plantar pad.  Vascular: Dorsalis Pedis artery and Posterior Tibial  artery pedal pulses are 2/4 bilateral with immedate capillary fill time. Pedal hair growth present. No varicosities and no lower extremity edema present bilateral.   Neruologic: Grossly intact via light touch bilateral. Vibratory intact via tuning fork bilateral. Protective threshold with Semmes Wienstein monofilament intact to all pedal sites bilateral. Patellar and Achilles deep tendon reflexes 2+ bilateral. No Babinski or clonus noted bilateral.   Musculoskeletal: No gross boney pedal deformities bilateral. No pain, crepitus, or limitation noted with foot and ankle range of motion bilateral. Muscular strength 5/5 in all groups tested bilateral.  Gait: Unassisted, Nonantalgic.    Radiographs:  None taken  Assessment & Plan:   Assessment: Verruca plantaris forefoot left  Plan: After debridement of the superficial skin overlying this very small wart I applied Cantharone under occlusion to be washed off thoroughly tomorrow and wrote a prescription for Efudex cream to be applied daily or twice daily under occlusion.  Follow-up with her in 6 weeks     Legion Discher T. Blaine, [place]

## 2018-12-01 ENCOUNTER — Encounter: Payer: Self-pay | Admitting: Podiatry

## 2018-12-06 ENCOUNTER — Ambulatory Visit: Payer: BLUE CROSS/BLUE SHIELD | Admitting: Podiatry

## 2018-12-06 ENCOUNTER — Encounter: Payer: Self-pay | Admitting: Podiatry

## 2018-12-06 DIAGNOSIS — B070 Plantar wart: Secondary | ICD-10-CM | POA: Diagnosis not present

## 2018-12-06 NOTE — Patient Instructions (Signed)

## 2018-12-06 NOTE — Progress Notes (Signed)
She presents today for follow-up of wart plantar aspect of the forefoot left.  States that is really been hurting particularly when I been walking on it.  Objective: Vital signs are stable alert and oriented x3.  Pulses are palpable.  There is no erythema edema cellulitis drainage odor she has a verrucoid lesion plantar aspect between the first and second toes of the left foot with thrombosed capillaries skin lines do circumvent the lesion as typical verruca.  It measures less than a centimeter in diameter.  Assessment: Verruca plantaris forefoot left.  Plan: Surgical curettage after local anesthetic was administered of a 50-50 mixture of Marcaine plain lidocaine with epinephrine was injected sub-lesional E.  She tolerated procedure well she was dressed with a dry sterile compressive dressing given both oral and written home-going instructions for the care and soaking of the toe as well as application of Aquaphor ointment.  I will follow-up with her in 2 weeks.

## 2018-12-13 ENCOUNTER — Telehealth: Payer: Self-pay | Admitting: *Deleted

## 2018-12-13 NOTE — Telephone Encounter (Signed)
I informed pt of Dr. Hyatt's review of results and orders. 

## 2018-12-13 NOTE — Telephone Encounter (Signed)
-----   Message from Garrel Ridgel, [place] sent at 12/12/2018  7:01 AM EDT ----- wart

## 2018-12-20 ENCOUNTER — Ambulatory Visit: Payer: BLUE CROSS/BLUE SHIELD | Admitting: Podiatry

## 2018-12-27 ENCOUNTER — Ambulatory Visit: Payer: BLUE CROSS/BLUE SHIELD | Admitting: Podiatry

## 2018-12-27 ENCOUNTER — Other Ambulatory Visit: Payer: BLUE CROSS/BLUE SHIELD

## 2019-09-19 ENCOUNTER — Encounter: Payer: Self-pay | Admitting: Plastic Surgery

## 2020-01-08 ENCOUNTER — Ambulatory Visit: Payer: BC Managed Care – PPO | Admitting: Family Medicine

## 2020-01-08 ENCOUNTER — Encounter: Payer: Self-pay | Admitting: Family Medicine

## 2020-01-08 ENCOUNTER — Other Ambulatory Visit: Payer: Self-pay

## 2020-01-08 VITALS — BP 123/84 | HR 75 | Temp 98.2°F | Resp 16 | Ht 64.0 in | Wt 203.0 lb

## 2020-01-08 DIAGNOSIS — H9203 Otalgia, bilateral: Secondary | ICD-10-CM

## 2020-01-08 MED ORDER — OFLOXACIN 0.3 % OT SOLN: 5.0000 [drp] | Freq: Every day | OTIC | 0 refills | Status: DC
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
## 2020-07-02 DIAGNOSIS — F902 Attention-deficit hyperactivity disorder, combined type: Secondary | ICD-10-CM | POA: Insufficient documentation

## 2020-07-19 ENCOUNTER — Other Ambulatory Visit (HOSPITAL_BASED_OUTPATIENT_CLINIC_OR_DEPARTMENT_OTHER): Payer: Self-pay | Admitting: Internal Medicine

## 2020-07-19 ENCOUNTER — Ambulatory Visit: Payer: BC Managed Care – PPO | Attending: Internal Medicine

## 2020-07-19 DIAGNOSIS — Z23 Encounter for immunization: Secondary | ICD-10-CM

## 2020-07-19 NOTE — Progress Notes (Signed)
   Covid-19 Vaccination Clinic  Name:  Katelyn Alvarado    MRN: [id number] DOB: 01/13/1996  07/19/2020  Katelyn Alvarado was observed post Covid-19 immunization for 15 minutes without incident. She was provided with Vaccine Information Sheet and instruction to access the V-Safe system.  Vaccinated by [facility]  Katelyn Alvarado was instructed to call 911 with any severe reactions post vaccine: Marland Kitchen Difficulty breathing  . Swelling of face and throat  . A fast heartbeat  . A bad rash all over body  . Dizziness and weakness

## 2020-07-24 MED FILL — PFIZER-BIONTECH COVID-19 VA: 30 | 1 days supply | Qty: 0 | Fill #0

## 2020-10-22 ENCOUNTER — Other Ambulatory Visit: Payer: Self-pay

## 2020-10-22 ENCOUNTER — Encounter: Payer: Self-pay | Admitting: Podiatry

## 2020-10-22 ENCOUNTER — Ambulatory Visit (INDEPENDENT_AMBULATORY_CARE_PROVIDER_SITE_OTHER): Payer: Self-pay | Admitting: Podiatry

## 2020-10-22 DIAGNOSIS — Q828 Other specified congenital malformations of skin: Secondary | ICD-10-CM

## 2020-10-22 NOTE — Progress Notes (Signed)
She presents today concerned about a possible wart to the lateral aspect of her fourth toe left foot.  States been there for the past year or so has had a wart removed previously by myself and is concerned that it may be a reoccurrence.  Objective: The area in which the wart was removed has gone on to heal uneventfully pulses remain palpable.  She has a small hyperkeratotic lesion to the lateral aspect of the PIPJ fourth digit left foot.  I debrided the area deeply today there was no bleeding there is no pinpoint bleeding no thrombosed capillaries and skin lines seem to pass through the lesion rather than circumventing it.  Assessment: Could be a primordial wart however I do think this is most likely chronic callus.  Plan: Counseled her on appropriate shoe gear not being too tight and if she has any recurrence to notify me.

## 2020-11-05 ENCOUNTER — Other Ambulatory Visit (HOSPITAL_BASED_OUTPATIENT_CLINIC_OR_DEPARTMENT_OTHER): Payer: Self-pay | Admitting: Physician Assistant

## 2020-11-05 MED FILL — VYVANSE 50 MG CAPSULE: 50 | 30 days supply | Qty: 30 | Fill #0

## 2020-12-06 ENCOUNTER — Other Ambulatory Visit (HOSPITAL_BASED_OUTPATIENT_CLINIC_OR_DEPARTMENT_OTHER): Payer: Self-pay | Admitting: Physician Assistant

## 2020-12-09 ENCOUNTER — Other Ambulatory Visit (HOSPITAL_BASED_OUTPATIENT_CLINIC_OR_DEPARTMENT_OTHER): Payer: Self-pay | Admitting: Physician Assistant

## 2020-12-12 ENCOUNTER — Other Ambulatory Visit: Payer: Self-pay | Admitting: Family Medicine

## 2020-12-12 ENCOUNTER — Ambulatory Visit (INDEPENDENT_AMBULATORY_CARE_PROVIDER_SITE_OTHER): Payer: BC Managed Care – PPO | Admitting: Family Medicine

## 2020-12-12 ENCOUNTER — Other Ambulatory Visit: Payer: Self-pay

## 2020-12-12 ENCOUNTER — Encounter: Payer: Self-pay | Admitting: Family Medicine

## 2020-12-12 VITALS — BP 120/80 | HR 116 | Temp 98.1°F | Resp 18 | Ht 65.0 in | Wt 194.0 lb

## 2020-12-12 DIAGNOSIS — S60511A Abrasion of right hand, initial encounter: Secondary | ICD-10-CM | POA: Diagnosis not present

## 2020-12-12 DIAGNOSIS — W540XXA Bitten by dog, initial encounter: Secondary | ICD-10-CM | POA: Diagnosis not present

## 2020-12-12 DIAGNOSIS — M79642 Pain in left hand: Secondary | ICD-10-CM

## 2020-12-12 DIAGNOSIS — M79644 Pain in right finger(s): Secondary | ICD-10-CM

## 2020-12-12 MED ORDER — AMOXICILLIN-POT CLAVULANATE 875-125 MG PO TABS: 1.0000 | ORAL_TABLET | Freq: Two times a day (BID) | ORAL | 0 refills | Status: DC

## 2020-12-12 MED ORDER — FLUCONAZOLE 150 MG PO TABS: 150.0000 mg | ORAL_TABLET | Freq: Once | ORAL | 0 refills | Status: DC

## 2020-12-12 NOTE — Patient Instructions (Signed)
Animal Bite, Adult Animal bites range from mild to serious. An animal bite can result in any of these injuries:  A scratch.  A deep, open cut.  A puncture of the skin.  A crush injury.  Tearing away of the skin or a body part.  A bone injury. A small bite from a house pet is usually less serious than a bite from a stray or wild animal, such as a raccoon, fox, skunk, or bat. That is because stray and wild animals have a higher risk of carrying a serious infection called rabies, which can be passed to humans through a bite. What increases the risk? You are more likely to be bitten by an animal if:  You are around unfamiliar pets.  You disturb an animal when it is eating, sleeping, or caring for its babies.  You are outdoors in a place where small, wild animals roam freely. What are the signs or symptoms? Common symptoms of an animal bite include:  Pain.  Bleeding.  Swelling.  Bruising. How is this diagnosed? This condition may be diagnosed based on a physical exam and medical history. Your health care provider will examine your wound and ask for details about the animal and how the bite happened. You may also have tests, such as:  Blood tests to check for infection.  X-rays to check for damage to bones or joints.  Taking a fluid sample from your wound and checking it for infection (culture test). How is this treated? Treatment varies depending on the type of animal, where the bite is on your body, and your medical history. Treatment may include:  Caring for the wound. This often includes cleaning the wound, rinsing out (flushing) the wound with saline solution, and applying a bandage (dressing). In some cases, the wound may be closed with stitches (sutures), staples, skin glue, or adhesive strips.  Antibiotic medicine to prevent or treat infection. This medicine may be prescribed in pill or ointment form. If the bite area becomes infected, the medicine may be given through  an IV.  A tetanus shot to prevent tetanus infection.  Rabies treatment to prevent rabies infection. This will be done if the animal could have rabies.  Surgery. This may be done if a bite gets infected or if there is damage that needs to be repaired. Follow these instructions at home: Wound care  Follow instructions from your health care provider about how to take care of your wound. Make sure you: ? Wash your hands with soap and water before you change your dressing. If soap and water are not available, use hand sanitizer. ? Change your dressing as told by your health care provider. ? Leave sutures, skin glue, or adhesive strips in place. These skin closures may need to stay in place for 2 weeks or longer. If adhesive strip edges start to loosen and curl up, you may trim the loose edges. Do not remove adhesive strips completely unless your health care provider tells you to do that.  Check your wound every day for signs of infection. Check for: ? More redness, swelling, or pain. ? More fluid or blood. ? Warmth. ? Pus or a bad smell.   Medicines  Take or apply over-the-counter and prescription medicines only as told by your health care provider.  If you were prescribed an antibiotic, take or apply it as told by your health care provider. Do not stop using the antibiotic even if your condition improves. General instructions  Keep the injured  area raised (elevated) above the level of your heart while you are sitting or lying down, if this is possible.  If directed, put ice on the injured area. ? Put ice in a plastic bag. ? Place a towel between your skin and the bag. ? Leave the ice on for 20 minutes, 2-3 times per day.  Keep all follow-up visits as told by your health care provider. This is important.   Contact a health care provider if:  You have more redness, swelling, or pain around your wound.  Your wound feels warm to the touch.  You have a fever or chills.  You have a  general feeling of sickness (malaise).  You feel nauseous or you vomit.  You have pain that does not get better. Get help right away if:  You have a red streak that leads away from your wound.  You have non-clear fluid or more blood coming from your wound.  There is pus or a bad smell coming from your wound.  You have trouble moving your injured area.  You have numbness or tingling that extends beyond the wound. Summary  Animal bites can range from mild to serious. An animal bite can cause a scratch on the skin, a deep open cut, a puncture of the skin, a crush injury, tearing away of the skin or a body part, or a bone injury.  Your health care provider will examine your wound and ask for details about the animal and how the bite happened.  You may also have tests such as a blood test, X-ray, or testing of a fluid sample from your wound (culture test).  Treatment may include wound care, antibiotic medicine, a tetanus shot, and rabies treatment if the animal could have rabies. This information is not intended to replace advice given to you by your health care provider. Make sure you discuss any questions you have with your health care provider. Document Revised: 07/09/2020 Document Reviewed: 07/09/2020 Elsevier Patient Education  2021 [employer].

## 2020-12-12 NOTE — Progress Notes (Signed)
Patient ID: Katelyn Alvarado, female    DOB: November 05, 1995  Age: 25 y.o. MRN: [id number]    Subjective:  Subjective  HPI Katelyn Alvarado presents for dog bite last night ----- 2 of her friends   Review of Systems  Constitutional: Negative for appetite change, diaphoresis, fatigue and unexpected weight change.  Eyes: Negative for pain, redness and visual disturbance.  Respiratory: Negative for cough, chest tightness, shortness of breath and wheezing.   Cardiovascular: Negative for chest pain, palpitations and leg swelling.  Endocrine: Negative for cold intolerance, heat intolerance, polydipsia, polyphagia and polyuria.  Genitourinary: Negative for difficulty urinating, dysuria and frequency.  Neurological: Negative for dizziness, light-headedness, numbness and headaches.    History Past Medical History:  Diagnosis Date  . Headache    migaines  . History of ear infection   . Hx of fracture of arm    left   . Hx of varicella    titer confirmed   . PPD screening test    negative     She has a past surgical history that includes Tympanostomy tube placement; Wisdom tooth extraction; and Tympanoplasty (Right, 07/15/2016).   Her family history includes Asthma in her sister; Hypertension in her unknown relative.She reports that she has never smoked. She has never used smokeless tobacco. She reports that she does not drink alcohol and does not use drugs.  Current Outpatient Medications on File Prior to Visit  Medication Sig Dispense Refill  . citalopram (CELEXA) 40 MG tablet Take by mouth.    . hydrOXYzine (ATARAX/VISTARIL) 25 MG tablet Take by mouth.    . norethindrone (MICRONOR,CAMILA,ERRIN) 0.35 MG tablet Take by mouth.    Marland Kitchen ofloxacin (FLOXIN OTIC) 0.3 % OTIC solution Place 5-10 drops into both ears daily. Use for up to one week if needed for otitis externa 5 mL 0  . VYVANSE 50 MG capsule Take 50 mg by mouth every morning.     No current facility-administered medications on file  prior to visit.     Objective:  Objective  Physical Exam Vitals and nursing note reviewed.  Constitutional:      Appearance: She is well-developed.  HENT:     Head: Normocephalic and atraumatic.  Eyes:     Conjunctiva/sclera: Conjunctivae normal.  Neck:     Thyroid: No thyromegaly.     Vascular: No carotid bruit or JVD.  Cardiovascular:     Rate and Rhythm: Normal rate and regular rhythm.     Heart sounds: Normal heart sounds. No murmur heard.   Pulmonary:     Effort: Pulmonary effort is normal. No respiratory distress.     Breath sounds: Normal breath sounds. No wheezing or rales.  Chest:     Chest wall: No tenderness.  Musculoskeletal:        General: Swelling and tenderness present.     Right hand: Swelling and tenderness present.     Left hand: Swelling and tenderness present.       Arms:     Cervical back: Normal range of motion and neck supple.  Skin:    Findings: Wound present. No lesion.  Neurological:     Mental Status: She is alert and oriented to person, place, and time.    BP 128/90 (BP Location: Left Arm, Patient Position: Sitting, Cuff Size: Large)   Pulse (!) 116   Temp 98.1 F (36.7 C) (Oral)   Resp 18   Ht 5\' 5"  (1.651 m)   Wt 194 lb (88 kg)  SpO2 97%   BMI 32.28 kg/m  Wt Readings from Last 3 Encounters:  12/12/20 194 lb (88 kg)  01/08/20 203 lb (92.1 kg)  03/30/16 180 lb 8 oz (81.9 kg) (95 %, Z= 1.64)*   * Growth percentiles are based on CDC (Girls, 2-20 Years) data.       Lab Results  Component Value Date   WBC 11.0 (H) 07/08/2016   HGB 13.2 07/08/2016   HCT 38.8 07/08/2016   PLT 321 07/08/2016   GLUCOSE 83 05/24/2015   CHOL 114 05/24/2015   TRIG 205.0 (H) 05/24/2015   HDL 39.50 05/24/2015   LDLDIRECT 55.0 05/24/2015   ALT 15 05/24/2015   AST 16 05/24/2015   NA 137 05/24/2015   K 4.1 05/24/2015   CL 102 05/24/2015   CREATININE 0.65 05/24/2015   BUN 13 05/24/2015   CO2 28 05/24/2015   TSH 2.83 05/24/2015    No  results found.   Assessment & Plan:  Plan  I am having Katelyn Alvarado start on fluconazole. I am also having her maintain her norethindrone, ofloxacin, citalopram, Vyvanse, hydrOXYzine, and amoxicillin-clavulanate.  Meds ordered this encounter  Medications  . DISCONTD: amoxicillin-clavulanate (AUGMENTIN) 875-125 MG tablet    Sig: Take 1 tablet by mouth 2 (two) times daily.    Dispense:  20 tablet    Refill:  0  . amoxicillin-clavulanate (AUGMENTIN) 875-125 MG tablet    Sig: Take 1 tablet by mouth 2 (two) times daily.    Dispense:  20 tablet    Refill:  0  . fluconazole (DIFLUCAN) 150 MG tablet    Sig: Take 1 tablet (150 mg total) by mouth once for 1 dose. Repeat if needed    Dispense:  2 tablet    Refill:  0    Problem List Items Addressed This Visit   None   Visit Diagnoses    Dog bite, initial encounter    -  Primary   Relevant Medications   amoxicillin-clavulanate (AUGMENTIN) 875-125 MG tablet    tetanus utd abx per orders   Follow-up: Return if symptoms worsen or fail to improve.  Ann Held, DO

## 2020-12-19 MED FILL — hydrOXYzine HCL 10 MG TABS: 10 | 20 days supply | Qty: 60 | Fill #0

## 2020-12-27 ENCOUNTER — Other Ambulatory Visit (HOSPITAL_BASED_OUTPATIENT_CLINIC_OR_DEPARTMENT_OTHER): Payer: Self-pay | Admitting: Physician Assistant

## 2020-12-27 MED FILL — METHYLPHENIDATE HCL ER 27 M: 27 | 30 days supply | Qty: 30 | Fill #0

## 2021-01-14 ENCOUNTER — Other Ambulatory Visit (HOSPITAL_BASED_OUTPATIENT_CLINIC_OR_DEPARTMENT_OTHER): Payer: Self-pay

## 2021-01-14 MED ORDER — CITALOPRAM HYDROBROMIDE 40 MG PO TABS
ORAL_TABLET | ORAL | 1 refills | Status: DC
  Filled 2021-01-14: qty 90, 90d supply, fill #0

## 2021-01-20 ENCOUNTER — Other Ambulatory Visit (HOSPITAL_BASED_OUTPATIENT_CLINIC_OR_DEPARTMENT_OTHER): Payer: Self-pay

## 2021-01-20 MED ORDER — VYVANSE 50 MG PO CAPS
ORAL_CAPSULE | ORAL | 0 refills | Status: DC
Start: 2021-01-20 — End: 2021-02-25
  Filled 2021-01-20: qty 30, 30d supply, fill #0

## 2021-02-03 ENCOUNTER — Emergency Department (HOSPITAL_BASED_OUTPATIENT_CLINIC_OR_DEPARTMENT_OTHER): Payer: BC Managed Care – PPO

## 2021-02-03 ENCOUNTER — Emergency Department (HOSPITAL_BASED_OUTPATIENT_CLINIC_OR_DEPARTMENT_OTHER)
Admission: EM | Admit: 2021-02-03 | Discharge: 2021-02-04 | Disposition: A | Discharge status: Home / Self Care | Payer: BC Managed Care – PPO | Attending: Emergency Medicine | Admitting: Emergency Medicine

## 2021-02-03 ENCOUNTER — Encounter (HOSPITAL_BASED_OUTPATIENT_CLINIC_OR_DEPARTMENT_OTHER): Payer: Self-pay

## 2021-02-03 ENCOUNTER — Other Ambulatory Visit: Payer: Self-pay

## 2021-02-03 DIAGNOSIS — R002 Palpitations: Secondary | ICD-10-CM | POA: Diagnosis not present

## 2021-02-03 DIAGNOSIS — R519 Headache, unspecified: Secondary | ICD-10-CM | POA: Diagnosis present

## 2021-02-03 DIAGNOSIS — U071 COVID-19: Secondary | ICD-10-CM | POA: Diagnosis not present

## 2021-02-03 MED ORDER — SODIUM CHLORIDE 0.9 % IV BOLUS
1000.0000 mL | Freq: Once | INTRAVENOUS | Status: AC
  Administered 2021-02-03: 1000 mL via INTRAVENOUS

## 2021-02-03 MED ORDER — IOHEXOL 300 MG/ML  SOLN
100.0000 mL | Freq: Once | INTRAMUSCULAR | Status: AC | PRN
  Administered 2021-02-03: 100 mL via INTRAVENOUS

## 2021-02-03 NOTE — ED Triage Notes (Signed)
Pt reports HA and palpitations - states that her HR went up to 150 at home    Tested covid positive 01/29/2021 - vaccinated to  covid   Took tylenol at 1800

## 2021-02-03 NOTE — ED Provider Notes (Signed)
[facility] EMERGENCY DEPT Provider Note   CSN: [id number] Arrival date & time: 02/03/21  2207     History Chief Complaint  Patient presents with  . Palpitations  . Headache    Katelyn Alvarado is a 25 y.o. female.   Palpitations Associated symptoms: cough   Associated symptoms: no back pain   Headache Associated symptoms: congestion and cough   Associated symptoms: no abdominal pain and no back pain   Patient presents with fast heart rate COVID-positive and headache.  Tested +5 days ago and had symptoms a few days before that.  States she got at a wedding.  She is vaccinated.  Has a headache.  Behind left eye.  Will occasionally get headaches but usually do not last as long and usually goes away.  No fevers.  No numbness weakness.  No vision changes.  No confusion.  Felt her heart going fast with some ambulation.  Came down with rest.  Did not feel lightheaded or dizzy.  No neck pain.  No abdominal pain.  Denies possibly of pregnancy.  Is not had kidney problems previously.     Past Medical History:  Diagnosis Date  . Headache    migaines  . History of ear infection   . Hx of fracture of arm    left   . Hx of varicella    titer confirmed   . PPD screening test    negative     Patient Active Problem List   Diagnosis Date Noted  . Stress reaction 06/14/2017  . Binge eating 05/05/2017  . Elevated TSH 05/05/2017  . 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 Obesity (BMI 30.0-34.9) 02/12/2017  . Impacted cerumen of right ear 05/13/2016  . Perforated right tympanic membrane on examination 05/13/2016  . New persistent daily headache 03/27/2016  . Migraine without aura and without status migrainosus, not intractable 03/27/2016  . Acute pharyngitis 01/17/2009    Past Surgical History:  Procedure Laterality Date  . TYMPANOPLASTY Right 07/15/2016   Procedure: TYMPANOPLASTY;  Surgeon: Izora Gala, MD;  Location: [facility];  Service: ENT;  Laterality: Right;  .  TYMPANOSTOMY TUBE PLACEMENT    . WISDOM TOOTH EXTRACTION       OB History   No obstetric history on file.     Family History  Problem Relation Age of Onset  . Hypertension Other   . Asthma Sister     Social History   Tobacco Use  . Smoking status: Never Smoker  . Smokeless tobacco: Never Used  Substance Use Topics  . Alcohol use: No    Alcohol/week: 0.0 standard drinks  . Drug use: No    Home Medications Prior to Admission medications   Medication Sig Start Date End Date Taking? Authorizing Provider  amoxicillin-clavulanate (AUGMENTIN) 875-125 MG tablet TAKE 1 TABLET BY MOUTH 2 (TWO) TIMES DAILY. 12/12/20 12/12/21  Ann Held, DO  citalopram (CELEXA) 40 MG tablet Take by mouth. 12/05/19   [provider]  citalopram (CELEXA) 40 MG tablet Take one tablet (40 mg dose) by mouth daily. 01/14/21     COVID-19 mRNA vaccine, Pfizer, 30 MCG/0.3ML injection INJECT AS DIRECTED 07/19/20 07/19/21  Carlyle Basques, MD  fluconazole (DIFLUCAN) 150 MG tablet TAKE 1 TABLET (150 MG TOTAL) BY MOUTH ONCE FOR 1 DOSE. REPEAT IF NEEDED 12/12/20 12/12/21  Roma Schanz R, DO  hydrOXYzine (ATARAX/VISTARIL) 10 MG tablet TAKE 1 TABLET BY MOUTH 3 TIMES DAILY AS NEEDED FOR ANXIETY 12/06/20 12/06/21  Scherrie Gerlach  E, PA  hydrOXYzine (ATARAX/VISTARIL) 25 MG tablet Take by mouth. 07/18/20   [provider]  lisdexamfetamine (VYVANSE) 50 MG capsule TAKE ONE CAPSULE (50 MG DOSE) BY MOUTH EVERY MORNING. 12/09/20 06/07/21  Young, Kyla Balzarine, PA  lisdexamfetamine (VYVANSE) 50 MG capsule TAKE ONE CAPSULE (50 MG DOSE) BY MOUTH EVERY MORNING. 11/05/20 05/04/21  Rich Fuchs, PA  lisdexamfetamine (VYVANSE) 50 MG capsule Take 1 capsule by mouth once every morning 01/20/21     methylphenidate 27 MG PO CR tablet TAKE ONE TABLET (27 MG DOSE) BY MOUTH DAILY. 12/27/20 06/25/21  Rich Fuchs, PA  norethindrone (MICRONOR,CAMILA,ERRIN) 0.35 MG tablet Take by mouth. 08/09/18   [provider]   ofloxacin (FLOXIN OTIC) 0.3 % OTIC solution Place 5-10 drops into both ears daily. Use for up to one week if needed for otitis externa 01/08/20   Copland, Gay Filler, MD  VYVANSE 50 MG capsule Take 50 mg by mouth every morning. 09/24/20   [provider]    Allergies    Sumatriptan  Review of Systems   Review of Systems  Constitutional: Positive for appetite change.  HENT: Positive for congestion.   Respiratory: Positive for cough.   Cardiovascular: Positive for palpitations.  Gastrointestinal: Negative for abdominal pain.  Musculoskeletal: Negative for back pain.  Skin: Negative for rash.  Neurological: Positive for headaches.  Psychiatric/Behavioral: Negative for confusion.    Physical Exam Updated Vital Signs BP (!) 159/131   Pulse (!) 108   Temp 98.3 F (36.8 C) (Oral)   Resp 18   Ht 5\' 4"  (1.626 m)   Wt 88 kg   LMP 01/22/2020 Comment: pt shielded  SpO2 100%   BMI 33.30 kg/m   Physical Exam Vitals reviewed.  HENT:     Head: Atraumatic.  Eyes:     Extraocular Movements: Extraocular movements intact.     Right eye: Normal extraocular motion.     Left eye: Normal extraocular motion.  Cardiovascular:     Rate and Rhythm: Normal rate and regular rhythm.  Pulmonary:     Breath sounds: No wheezing or rhonchi.  Abdominal:     Tenderness: There is no abdominal tenderness.  Musculoskeletal:     Cervical back: Neck supple.  Skin:    General: Skin is warm.  Neurological:     Mental Status: She is alert.     Cranial Nerves: No cranial nerve deficit.  Psychiatric:        Mood and Affect: Mood normal.        Behavior: Behavior normal.     ED Results / Procedures / Treatments   Labs (all labs ordered are listed, but only abnormal results are displayed) Labs Reviewed - No data to display  EKG EKG Interpretation  Date/Time:  Monday Feb 03 2021 22:27:12 EDT Ventricular Rate:  113 PR Interval:  140 QRS Duration: 82 QT Interval:  322 QTC  Calculation: 441 R Axis:   89 Text Interpretation: Sinus tachycardia Nonspecific T wave abnormality Abnormal ECG No old tracing to compare Confirmed by Davonna Belling [phone]) on 02/03/2021 11:14:57 PM   Radiology CT VENOGRAM HEAD  Result Date: 02/04/2021 CLINICAL DATA:  Initial evaluation for acute headache, dural venous sinus thrombosis suspected. EXAM: CT VENOGRAM HEAD TECHNIQUE: Dedicated CT venogram images of the brain prior to and following the administration of IV contrast were obtained using the standard protocol. CONTRAST:  156mL OMNIPAQUE IOHEXOL 300 MG/ML  SOLN COMPARISON:  None available. FINDINGS: Brain: Cerebral volume within normal limits  for patient age. No evidence for acute intracranial hemorrhage. No findings to suggest acute large vessel territory infarct. No mass lesion, midline shift, or mass effect. Ventricles are normal in size without evidence for hydrocephalus. No extra-axial fluid collection identified. Vascular: No hyperdense vessel seen prior to contrast administration. Following contrast administration, normal enhancement seen throughout the superior sagittal sinus to the torcula. Left transverse and sigmoid sinuses are patent as is the visualized proximal left internal jugular vein. Right transverse and sigmoid sinuses are markedly hypoplastic but grossly patent as well. Straight sinus, vein of [place], and internal cerebral veins are patent. No findings to suggest cavernous sinus thrombosis. No dural sinus thrombosis. No other visible pathologic enhancement within the brain. Skull: Scalp soft tissues demonstrate no acute abnormality. Calvarium intact. Sinuses/Orbits: Globes and orbital soft tissues within normal limits. Visualized paranasal sinuses are clear. No mastoid effusion. IMPRESSION: 1. Negative CT venogram. No evidence for dural sinus thrombosis. 2. No other acute intracranial abnormality. Electronically Signed   By: Jeannine Boga M.D.   On: 02/04/2021 00:45     Procedures Procedures   Medications Ordered in ED Medications  sodium chloride 0.9 % bolus 1,000 mL (0 mLs Intravenous Stopped 02/04/21 0110)  iohexol (OMNIPAQUE) 300 MG/ML solution 100 mL (100 mLs Intravenous Contrast Given 02/03/21 2358)  ketorolac (TORADOL) 15 MG/ML injection 15 mg (15 mg Intravenous Given 02/04/21 0059)    ED Course  I have reviewed the triage vital signs and the nursing notes.  Pertinent labs & imaging results that were available during my care of the patient were reviewed by me and considered in my medical decision making (see chart for details).    MDM Rules/Calculators/A&P     Patient with positive COVID test.  Headache.  Does not tend to get headaches like this.  Usually would be shorter acting.  Is vaccinated.  States she did feel her heart racing previously but now improved.  Heart rate reassuring here.  Not hypoxic.  CT venogram done due to hyper coagulable state from [place].  Negative for clot.  Do not think we need further work-up.  Doubt meningitis.  Discharge home.  Given Toradol for symptomatic relief. Final Clinical Impression(s) / ED Diagnoses Final diagnoses:  COVID-19  Acute nonintractable headache, unspecified headache type    Rx / DC Orders ED Discharge Orders    None       Davonna Belling, MD 02/04/21 [phone]

## 2021-02-04 MED ORDER — KETOROLAC TROMETHAMINE 15 MG/ML IJ SOLN
15.0000 mg | Freq: Once | INTRAMUSCULAR | Status: AC
  Administered 2021-02-04: 15 mg via INTRAVENOUS
  Filled 2021-02-04: qty 1

## 2021-02-06 ENCOUNTER — Other Ambulatory Visit: Payer: Self-pay

## 2021-02-06 ENCOUNTER — Telehealth: Payer: Self-pay

## 2021-02-06 ENCOUNTER — Other Ambulatory Visit (INDEPENDENT_AMBULATORY_CARE_PROVIDER_SITE_OTHER): Payer: BC Managed Care – PPO

## 2021-02-06 DIAGNOSIS — Z111 Encounter for screening for respiratory tuberculosis: Secondary | ICD-10-CM

## 2021-02-06 DIAGNOSIS — Z0000 Encounter for general adult medical examination without abnormal findings: Secondary | ICD-10-CM

## 2021-02-06 NOTE — Telephone Encounter (Signed)
Pt needs Tb Gold test for school.

## 2021-02-07 ENCOUNTER — Other Ambulatory Visit (INDEPENDENT_AMBULATORY_CARE_PROVIDER_SITE_OTHER): Payer: BC Managed Care – PPO

## 2021-02-07 ENCOUNTER — Encounter: Payer: Self-pay | Admitting: Family Medicine

## 2021-02-07 ENCOUNTER — Other Ambulatory Visit: Payer: Self-pay | Admitting: Family Medicine

## 2021-02-07 DIAGNOSIS — R000 Tachycardia, unspecified: Secondary | ICD-10-CM

## 2021-02-07 LAB — D-DIMER, QUANTITATIVE: D-Dimer, Quant: <0.19 mcg/mL FEU (ref <0.50)

## 2021-02-07 NOTE — Progress Notes (Signed)
Spoke with pt on the phone Her pulse continues to be high- 114 today Sx of covid began 10 days ago Some SOB continues  sats are ok- 98% Pt would like to do a D dimer to risk stratify for PE- will order for her stat.  She is aware this may lead to a CT angiogram if elevated

## 2021-02-08 LAB — QUANTIFERON-TB GOLD PLUS
Mitogen-NIL: >10 IU/mL
NIL: 0.05 IU/mL
QuantiFERON-TB Gold Plus: NEGATIVE
TB1-NIL: 0 IU/mL
TB2-NIL: 0 IU/mL

## 2021-02-13 ENCOUNTER — Other Ambulatory Visit: Payer: Self-pay

## 2021-02-13 ENCOUNTER — Ambulatory Visit (HOSPITAL_BASED_OUTPATIENT_CLINIC_OR_DEPARTMENT_OTHER)
Admission: RE | Admit: 2021-02-13 | Discharge: 2021-02-13 | Disposition: A | Discharge status: Home / Self Care | Payer: BC Managed Care – PPO | Source: Ambulatory Visit | Attending: Family Medicine | Admitting: Family Medicine

## 2021-02-13 ENCOUNTER — Ambulatory Visit (INDEPENDENT_AMBULATORY_CARE_PROVIDER_SITE_OTHER): Payer: BC Managed Care – PPO | Admitting: Family Medicine

## 2021-02-13 ENCOUNTER — Encounter: Payer: Self-pay | Admitting: Family Medicine

## 2021-02-13 VITALS — BP 132/80 | HR 117 | Temp 98.0°F | Resp 12 | Ht 65.0 in | Wt 194.0 lb

## 2021-02-13 DIAGNOSIS — R079 Chest pain, unspecified: Secondary | ICD-10-CM | POA: Diagnosis present

## 2021-02-13 DIAGNOSIS — F430 Acute stress reaction: Secondary | ICD-10-CM

## 2021-02-13 DIAGNOSIS — D72829 Elevated white blood cell count, unspecified: Secondary | ICD-10-CM

## 2021-02-13 DIAGNOSIS — R109 Unspecified abdominal pain: Secondary | ICD-10-CM

## 2021-02-13 DIAGNOSIS — R000 Tachycardia, unspecified: Secondary | ICD-10-CM

## 2021-02-13 DIAGNOSIS — R002 Palpitations: Secondary | ICD-10-CM | POA: Diagnosis not present

## 2021-02-13 DIAGNOSIS — R7989 Other specified abnormal findings of blood chemistry: Secondary | ICD-10-CM

## 2021-02-13 DIAGNOSIS — D691 Qualitative platelet defects: Secondary | ICD-10-CM

## 2021-02-13 DIAGNOSIS — U071 COVID-19: Secondary | ICD-10-CM

## 2021-02-13 DIAGNOSIS — K769 Liver disease, unspecified: Secondary | ICD-10-CM

## 2021-02-13 NOTE — Progress Notes (Signed)
Subjective:    Patient ID: Katelyn Alvarado, female    DOB: 01-15-1996, 25 y.o.   MRN: [id number]  Chief Complaint  Patient presents with  . Abdominal Pain    HPI Patient is in today for evaluation of atypical chest pain as she recovers from [place]. She is noting intermittent chest pressure which took her to the Emergency Room the other day. EKG did show some non specific T wave changes.  She is noting an epigastric pain radiating to the back. She has also been under a tremendous amount of stress while working full time, going to school and recovering from [place]. Denies SOB/HA/congestion/fevers/GI or GU c/o. Taking meds as prescribed  Past Medical History:  Diagnosis Date  . Headache    migaines  . History of ear infection   . Hx of fracture of arm    left   . Hx of varicella    titer confirmed   . PPD screening test    negative     Past Surgical History:  Procedure Laterality Date  . TYMPANOPLASTY Right 07/15/2016   Procedure: TYMPANOPLASTY;  Surgeon: Izora Gala, MD;  Location: [place];  Service: ENT;  Laterality: Right;  . TYMPANOSTOMY TUBE PLACEMENT    . WISDOM TOOTH EXTRACTION      Family History  Problem Relation Age of Onset  . Hypertension Other   . Asthma Sister     Social History   Socioeconomic History  . Marital status: Single    Spouse name: Not on file  . Number of children: Not on file  . Years of education: Not on file  . Highest education level: Not on file  Occupational History  . Not on file  Tobacco Use  . Smoking status: Never Smoker  . Smokeless tobacco: Never Used  Substance and Sexual Activity  . Alcohol use: No    Alcohol/week: 0.0 standard drinks  . Drug use: No  . Sexual activity: Not on file  Other Topics Concern  . Not on file  Social History Narrative   Neg tad  See   [facility] under centricity    In  [place] program to do clinical    Social Determinants of Health   Financial Resource Strain: Not on file  Food Insecurity: Not on  file  Transportation Needs: Not on file  Physical Activity: Not on file  Stress: Not on file  Social Connections: Not on file  Intimate Partner Violence: Not on file    Outpatient Medications Prior to Visit  Medication Sig Dispense Refill  . amoxicillin-clavulanate (AUGMENTIN) 875-125 MG tablet TAKE 1 TABLET BY MOUTH 2 (TWO) TIMES DAILY. 20 tablet 0  . citalopram (CELEXA) 40 MG tablet Take by mouth.    . citalopram (CELEXA) 40 MG tablet Take one tablet (40 mg dose) by mouth daily. 90 tablet 1  . COVID-19 mRNA vaccine, Pfizer, 30 MCG/0.3ML injection INJECT AS DIRECTED .3 mL 0  . fluconazole (DIFLUCAN) 150 MG tablet TAKE 1 TABLET (150 MG TOTAL) BY MOUTH ONCE FOR 1 DOSE. REPEAT IF NEEDED 2 tablet 0  . hydrOXYzine (ATARAX/VISTARIL) 10 MG tablet TAKE 1 TABLET BY MOUTH 3 TIMES DAILY AS NEEDED FOR ANXIETY 60 tablet 3  . lisdexamfetamine (VYVANSE) 50 MG capsule TAKE ONE CAPSULE (50 MG DOSE) BY MOUTH EVERY MORNING. 30 capsule 0  . lisdexamfetamine (VYVANSE) 50 MG capsule TAKE ONE CAPSULE (50 MG DOSE) BY MOUTH EVERY MORNING. 30 capsule 0  . lisdexamfetamine (VYVANSE) 50 MG capsule Take 1 capsule  by mouth once every morning 30 capsule 0  . methylphenidate 27 MG PO CR tablet TAKE ONE TABLET (27 MG DOSE) BY MOUTH DAILY. 30 tablet 0  . norethindrone (MICRONOR,CAMILA,ERRIN) 0.35 MG tablet Take by mouth.    Marland Kitchen ofloxacin (FLOXIN OTIC) 0.3 % OTIC solution Place 5-10 drops into both ears daily. Use for up to one week if needed for otitis externa 5 mL 0  . VYVANSE 50 MG capsule Take 50 mg by mouth every morning.    . hydrOXYzine (ATARAX/VISTARIL) 25 MG tablet Take by mouth.     No facility-administered medications prior to visit.    Allergies  Allergen Reactions  . Sumatriptan Swelling    Review of Systems  Constitutional: Positive for malaise/fatigue. Negative for fever.  HENT: Negative for congestion.   Eyes: Negative for blurred vision.  Respiratory: Negative for shortness of breath.    Cardiovascular: Positive for chest pain and palpitations. Negative for leg swelling.  Gastrointestinal: Positive for abdominal pain. Negative for blood in stool and nausea.  Genitourinary: Negative for dysuria and frequency.  Musculoskeletal: Negative for falls.  Skin: Negative for rash.  Neurological: Negative for dizziness, loss of consciousness and headaches.  Endo/Heme/Allergies: Negative for environmental allergies.  Psychiatric/Behavioral: Negative for depression. The patient is nervous/anxious.        Objective:    Physical Exam Vitals and nursing note reviewed.  Constitutional:      General: She is not in acute distress.    Appearance: She is well-developed.  HENT:     Head: Normocephalic and atraumatic.     Nose: Nose normal.  Eyes:     General:        Right eye: No discharge.        Left eye: No discharge.  Cardiovascular:     Rate and Rhythm: Regular rhythm. Tachycardia present.     Heart sounds: No murmur heard.   Pulmonary:     Effort: Pulmonary effort is normal.     Breath sounds: Normal breath sounds.  Abdominal:     General: Bowel sounds are normal. There is no abdominal bruit.     Palpations: Abdomen is soft.     Tenderness: There is no abdominal tenderness.  Musculoskeletal:     Cervical back: Normal range of motion and neck supple.  Skin:    General: Skin is warm and dry.  Neurological:     Mental Status: She is alert and oriented to person, place, and time.     BP 132/80 (BP Location: Left Arm, Cuff Size: Large)   Pulse (!) 117   Temp 98 F (36.7 C) (Oral)   Resp 12   Ht 5\' 5"  (1.651 m)   Wt 194 lb (88 kg)   LMP 01/21/2021   SpO2 98%   BMI 32.28 kg/m  Wt Readings from Last 3 Encounters:  02/13/21 194 lb (88 kg)  02/03/21 194 lb (88 kg)  12/12/20 194 lb (88 kg)    Diabetic Foot Exam - Simple   No data filed    Lab Results  Component Value Date   WBC 12.0 (H) 02/13/2021   HGB 13.5 02/13/2021   HCT 40.1 02/13/2021   PLT 379.0  02/13/2021   GLUCOSE 98 02/13/2021   CHOL 114 05/24/2015   TRIG 205.0 (H) 05/24/2015   HDL 39.50 05/24/2015   LDLDIRECT 55.0 05/24/2015   ALT 8 02/13/2021   AST 10 02/13/2021   NA 138 02/13/2021   K 4.1 02/13/2021   CL 103  02/13/2021   CREATININE 0.75 02/13/2021   BUN 12 02/13/2021   CO2 26 02/13/2021   TSH 1.29 02/13/2021    Lab Results  Component Value Date   TSH 1.29 02/13/2021   Lab Results  Component Value Date   WBC 12.0 (H) 02/13/2021   HGB 13.5 02/13/2021   HCT 40.1 02/13/2021   MCV 83.5 02/13/2021   PLT 379.0 02/13/2021   Lab Results  Component Value Date   NA 138 02/13/2021   K 4.1 02/13/2021   CO2 26 02/13/2021   GLUCOSE 98 02/13/2021   BUN 12 02/13/2021   CREATININE 0.75 02/13/2021   BILITOT 0.4 02/13/2021   ALKPHOS 45 02/13/2021   AST 10 02/13/2021   ALT 8 02/13/2021   PROT 7.1 02/13/2021   ALBUMIN 4.4 02/13/2021   CALCIUM 9.7 02/13/2021   GFR 111.83 02/13/2021   Lab Results  Component Value Date   CHOL 114 05/24/2015   Lab Results  Component Value Date   HDL 39.50 05/24/2015   No results found for: [facility] Lab Results  Component Value Date   TRIG 205.0 (H) 05/24/2015   Lab Results  Component Value Date   CHOLHDL 3 05/24/2015   No results found for: HGBA1C     Assessment & Plan:   Problem List Items Addressed This Visit    Elevated TSH    TSH normal on check today      Relevant Orders   TSH (Completed)   T4, free (Completed)   Platelet disorder (HCC)   Stress reaction    Consider Hydroxyzine 10 mg po qam each morning for the next week.       Tachycardia    Hydrate well, minimize caffeine and monitor      COVID-19    She is having some residual symptoms as she recovers from [place]. She is noting intermittent chest pressure which took her to the Emergency Room the other day. EKG did show some non specific T wave changes. Repeat EKG today shows the same thing. She is noting an epigastric pain radiating to the back so we  ordered an abdominal US which was negative for gallbladder stones. cxr negative.labs unremarkable. If her symptoms worsen she will need a referral to cardiology.       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      Elevated WBC count    Mild likely related to recent COVID infection, repeat cbc in a month or so       Other Visit Diagnoses    Palpitation    -  Primary   Relevant Orders   EKG 12-Lead (Completed)   DG Chest 2 View (Completed)   Chest pain, unspecified type       Relevant Orders   DG Chest 2 View (Completed)   Sedimentation rate (Completed)   Comprehensive metabolic panel (Completed)   Abdominal pain, unspecified abdominal location       Relevant Orders   CBC with Differential/Platelet (Completed)   TSH (Completed)   T4, free (Completed)   US Abdomen Complete   US Abdomen Complete (Completed)      I am having Mel Almond A. Tallie maintain her norethindrone, ofloxacin, citalopram, Vyvanse, methylphenidate, fluconazole, amoxicillin-clavulanate, lisdexamfetamine, hydrOXYzine, lisdexamfetamine, COVID-19 mRNA vaccine (Pfizer), citalopram, and Vyvanse.  No orders of the defined types were placed in this encounter.    Penni Homans, MD

## 2021-02-13 NOTE — Patient Instructions (Signed)
Gallbladder Eating Plan If you have a gallbladder condition, you may have trouble digesting fats. Eating a low-fat diet can help reduce your symptoms, and may be helpful before and after having surgery to remove your gallbladder (cholecystectomy). Your health care provider may recommend that you work with a diet and nutrition specialist (dietitian) to help you reduce the amount of fat in your diet. What are tips for following this plan? General guidelines  Limit your fat intake to less than 30% of your total daily calories. If you eat around 1,800 calories each day, this is less than 60 grams (g) of fat per day.  Fat is an important part of a healthy diet. Eating a low-fat diet can make it hard to maintain a healthy body weight. Ask your dietitian how much fat, calories, and other nutrients you need each day.  Eat small, frequent meals throughout the day instead of three large meals.  Drink at least 8-10 cups of fluid a day. Drink enough fluid to keep your urine clear or pale yellow.  Limit alcohol intake to no more than 1 drink a day for nonpregnant women and 2 drinks a day for men. One drink equals 12 oz of beer, 5 oz of wine, or 1 oz of hard liquor. Reading food labels  Check Nutrition Facts on food labels for the amount of fat per serving. Choose foods with less than 3 grams of fat per serving.   Shopping  Choose nonfat and low-fat healthy foods. Look for the words "nonfat," "low fat," or "fat free."  Avoid buying processed or prepackaged foods. Cooking  Cook using low-fat methods, such as baking, broiling, grilling, or boiling.  Cook with small amounts of healthy fats, such as olive oil, grapeseed oil, canola oil, or sunflower oil. What foods are recommended?  All fresh, frozen, or canned fruits and vegetables.  Whole grains.  Low-fat or non-fat (skim) milk and yogurt.  Lean meat, skinless poultry, fish, eggs, and beans.  Low-fat protein supplement powders or  drinks.  Spices and herbs. What foods are not recommended?  High-fat foods. These include baked goods, fast food, fatty cuts of meat, ice cream, french toast, sweet rolls, pizza, cheese bread, foods covered with butter, creamy sauces, or cheese.  Fried foods. These include french fries, tempura, battered fish, breaded chicken, fried breads, and sweets.  Foods with strong odors.  Foods that cause bloating and gas. Summary  A low-fat diet can be helpful if you have a gallbladder condition, or before and after gallbladder surgery.  Limit your fat intake to less than 30% of your total daily calories. This is about 60 g of fat if you eat 1,800 calories each day.  Eat small, frequent meals throughout the day instead of three large meals. This information is not intended to replace advice given to you by your health care provider. Make sure you discuss any questions you have with your health care provider. Document Revised: 05/02/2020 Document Reviewed: 05/02/2020 Elsevier Patient Education  2021 Elsevier Inc.  

## 2021-02-14 ENCOUNTER — Ambulatory Visit (HOSPITAL_BASED_OUTPATIENT_CLINIC_OR_DEPARTMENT_OTHER)
Admission: RE | Admit: 2021-02-14 | Discharge: 2021-02-14 | Disposition: A | Discharge status: Home / Self Care | Payer: BC Managed Care – PPO | Source: Ambulatory Visit | Attending: Family Medicine | Admitting: Family Medicine

## 2021-02-14 DIAGNOSIS — R109 Unspecified abdominal pain: Secondary | ICD-10-CM | POA: Diagnosis not present

## 2021-02-14 LAB — CBC WITH DIFFERENTIAL/PLATELET
Basophils Absolute: 0 10*3/uL (ref 0.0–0.1)
Basophils Relative: 0.3 % (ref 0.0–3.0)
Eosinophils Absolute: 0 10*3/uL (ref 0.0–0.7)
Eosinophils Relative: 0.4 % (ref 0.0–5.0)
HCT: 40.1 % (ref 36.0–46.0)
Hemoglobin: 13.5 g/dL (ref 12.0–15.0)
Lymphocytes Relative: 20.8 % (ref 12.0–46.0)
Lymphs Abs: 2.5 10*3/uL (ref 0.7–4.0)
MCHC: 33.8 g/dL (ref 30.0–36.0)
MCV: 83.5 fl (ref 78.0–100.0)
Monocytes Absolute: 0.5 10*3/uL (ref 0.1–1.0)
Monocytes Relative: 4.4 % (ref 3.0–12.0)
Neutro Abs: 8.9 10*3/uL — ABNORMAL HIGH (ref 1.4–7.7)
Neutrophils Relative %: 74.1 % (ref 43.0–77.0)
Platelets: 379 10*3/uL (ref 150.0–400.0)
RBC: 4.81 Mil/uL (ref 3.87–5.11)
RDW: 13.6 % (ref 11.5–15.5)
WBC: 12 10*3/uL — ABNORMAL HIGH (ref 4.0–10.5)

## 2021-02-14 LAB — COMPREHENSIVE METABOLIC PANEL
ALT: 8 U/L (ref 0–35)
AST: 10 U/L (ref 0–37)
Albumin: 4.4 g/dL (ref 3.5–5.2)
Alkaline Phosphatase: 45 U/L (ref 39–117)
BUN: 12 mg/dL (ref 6–23)
CO2: 26 mEq/L (ref 19–32)
Calcium: 9.7 mg/dL (ref 8.4–10.5)
Chloride: 103 mEq/L (ref 96–112)
Creatinine, Ser: 0.75 mg/dL (ref 0.40–1.20)
GFR: 111.83 mL/min (ref >60.00)
Glucose, Bld: 98 mg/dL (ref 70–99)
Potassium: 4.1 mEq/L (ref 3.5–5.1)
Sodium: 138 mEq/L (ref 135–145)
Total Bilirubin: 0.4 mg/dL (ref 0.2–1.2)
Total Protein: 7.1 g/dL (ref 6.0–8.3)

## 2021-02-14 LAB — SEDIMENTATION RATE: Sed Rate: 19 mm/hr (ref 0–20)

## 2021-02-14 LAB — T4, FREE: Free T4: 0.73 ng/dL (ref 0.60–1.60)

## 2021-02-14 LAB — TSH: TSH: 1.29 u[IU]/mL (ref 0.35–4.50)

## 2021-02-15 DIAGNOSIS — K769 Liver disease, unspecified: Secondary | ICD-10-CM | POA: Insufficient documentation

## 2021-02-15 DIAGNOSIS — U071 COVID-19: Secondary | ICD-10-CM | POA: Insufficient documentation

## 2021-02-15 DIAGNOSIS — R000 Tachycardia, unspecified: Secondary | ICD-10-CM | POA: Insufficient documentation

## 2021-02-15 DIAGNOSIS — D72829 Elevated white blood cell count, unspecified: Secondary | ICD-10-CM | POA: Insufficient documentation

## 2021-02-15 NOTE — Assessment & Plan Note (Signed)
TSH normal on check today

## 2021-02-15 NOTE — Assessment & Plan Note (Signed)
Hydrate well, minimize caffeine and monitor

## 2021-02-15 NOTE — Assessment & Plan Note (Signed)
Mild likely related to recent COVID infection, repeat cbc in a month or so

## 2021-02-15 NOTE — Assessment & Plan Note (Signed)
Consider Hydroxyzine 10 mg po qam each morning for the next week.

## 2021-02-15 NOTE — Assessment & Plan Note (Signed)
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

## 2021-02-15 NOTE — Assessment & Plan Note (Addendum)
She is having some residual symptoms as she recovers from [place]. She is noting intermittent chest pressure which took her to the Emergency Room the other day. EKG did show some non specific T wave changes. Repeat EKG today shows the same thing. She is noting an epigastric pain radiating to the back so we ordered an abdominal US which was negative for gallbladder stones. cxr negative.labs unremarkable. If her symptoms worsen she will need a referral to cardiology.

## 2021-02-25 ENCOUNTER — Other Ambulatory Visit (HOSPITAL_BASED_OUTPATIENT_CLINIC_OR_DEPARTMENT_OTHER): Payer: Self-pay

## 2021-02-25 MED ORDER — VYVANSE 50 MG PO CAPS
ORAL_CAPSULE | ORAL | 0 refills | Status: DC
  Filled 2021-02-25: qty 30, 30d supply, fill #0

## 2021-02-28 ENCOUNTER — Other Ambulatory Visit (HOSPITAL_BASED_OUTPATIENT_CLINIC_OR_DEPARTMENT_OTHER): Payer: Self-pay

## 2021-03-04 ENCOUNTER — Other Ambulatory Visit: Payer: Self-pay | Admitting: Family Medicine

## 2021-03-04 DIAGNOSIS — R000 Tachycardia, unspecified: Secondary | ICD-10-CM

## 2021-03-07 ENCOUNTER — Other Ambulatory Visit (HOSPITAL_BASED_OUTPATIENT_CLINIC_OR_DEPARTMENT_OTHER): Payer: Self-pay

## 2021-03-07 MED ORDER — NORETHINDRONE 0.35 MG PO TABS
1.0000 | ORAL_TABLET | Freq: Every day | ORAL | 3 refills | Status: DC
  Filled 2021-03-07: qty 84, 84d supply, fill #0

## 2021-03-11 DIAGNOSIS — Z8781 Personal history of (healed) traumatic fracture: Secondary | ICD-10-CM | POA: Insufficient documentation

## 2021-03-11 DIAGNOSIS — R519 Headache, unspecified: Secondary | ICD-10-CM | POA: Insufficient documentation

## 2021-03-11 DIAGNOSIS — Z8619 Personal history of other infectious and parasitic diseases: Secondary | ICD-10-CM | POA: Insufficient documentation

## 2021-03-11 DIAGNOSIS — Z8669 Personal history of other diseases of the nervous system and sense organs: Secondary | ICD-10-CM | POA: Insufficient documentation

## 2021-03-11 DIAGNOSIS — Z111 Encounter for screening for respiratory tuberculosis: Secondary | ICD-10-CM | POA: Insufficient documentation

## 2021-03-12 ENCOUNTER — Other Ambulatory Visit: Payer: Self-pay

## 2021-03-12 ENCOUNTER — Ambulatory Visit (INDEPENDENT_AMBULATORY_CARE_PROVIDER_SITE_OTHER): Payer: BC Managed Care – PPO | Admitting: Psychology

## 2021-03-12 DIAGNOSIS — F411 Generalized anxiety disorder: Secondary | ICD-10-CM

## 2021-03-13 ENCOUNTER — Ambulatory Visit (INDEPENDENT_AMBULATORY_CARE_PROVIDER_SITE_OTHER): Payer: BC Managed Care – PPO

## 2021-03-13 ENCOUNTER — Ambulatory Visit: Payer: BC Managed Care – PPO | Admitting: Cardiology

## 2021-03-13 ENCOUNTER — Encounter: Payer: Self-pay | Admitting: Cardiology

## 2021-03-13 ENCOUNTER — Telehealth: Payer: Self-pay

## 2021-03-13 ENCOUNTER — Other Ambulatory Visit (HOSPITAL_BASED_OUTPATIENT_CLINIC_OR_DEPARTMENT_OTHER): Payer: Self-pay

## 2021-03-13 VITALS — BP 146/98 | HR 90 | Ht 65.0 in | Wt 200.0 lb

## 2021-03-13 DIAGNOSIS — E669 Obesity, unspecified: Secondary | ICD-10-CM | POA: Diagnosis not present

## 2021-03-13 DIAGNOSIS — R002 Palpitations: Secondary | ICD-10-CM | POA: Diagnosis not present

## 2021-03-13 DIAGNOSIS — R030 Elevated blood-pressure reading, without diagnosis of hypertension: Secondary | ICD-10-CM | POA: Diagnosis not present

## 2021-03-13 DIAGNOSIS — Z8616 Personal history of COVID-19: Secondary | ICD-10-CM

## 2021-03-13 DIAGNOSIS — R5383 Other fatigue: Secondary | ICD-10-CM | POA: Diagnosis not present

## 2021-03-13 MED ORDER — PROPRANOLOL HCL 10 MG PO TABS
10.0000 mg | ORAL_TABLET | Freq: Two times a day (BID) | ORAL | 3 refills | Status: DC
  Filled 2021-03-13: qty 180, 90d supply, fill #0

## 2021-03-13 NOTE — Patient Instructions (Signed)
Medication Instructions:  Your physician has recommended you make the following change in your medication:  START: Propranolol 10 mg take one tablet by mouth twice daily.  *If you need a refill on your cardiac medications before your next appointment, please call your pharmacy*   Lab Work: None If you have labs (blood work) drawn today and your tests are completely normal, you will receive your results only by: [place] (if you have MyChart) OR A paper copy in the mail If you have any lab test that is abnormal or we need to change your treatment, we will call you to review the results.   Testing/Procedures: Your physician has requested that you have an echocardiogram. Echocardiography is a painless test that uses sound waves to create images of your heart. It provides your doctor with information about the size and shape of your heart and how well your heart's chambers and valves are working. This procedure takes approximately one hour. There are no restrictions for this procedure.  A zio monitor was ordered today. It will remain on for 7 days. You will then return monitor and event diary in provided box. It takes 1-2 weeks for report to be downloaded and returned to [place]. We will call you with the results. If monitor falls off or has orange flashing light, please call Zio for further instructions.     Follow-Up: At [facility], you and your health needs are our priority.  As part of our continuing mission to provide you with exceptional heart care, we have created designated Provider Care Teams.  These Care Teams include your primary Cardiologist (physician) and Advanced Practice Providers (APPs -  Physician Assistants and Nurse Practitioners) who all work together to provide you with the care you need, when you need it.  We recommend signing up for the patient portal called "MyChart".  Sign up information is provided on this After Visit Summary.  MyChart is used to connect with  patients for Virtual Visits (Telemedicine).  Patients are able to view lab/test results, encounter notes, upcoming appointments, etc.  Non-urgent messages can be sent to your provider as well.   To learn more about what you can do with MyChart, go to [email or web address].    Your next appointment:   12 week(s)  The format for your next appointment:   In Person  Provider:   Shirlee More, MD   Other Instructions

## 2021-03-13 NOTE — Telephone Encounter (Signed)
Patient needing immunization record printed. Records have been printed and given to patient for her records.

## 2021-03-13 NOTE — Progress Notes (Signed)
Cardiology Office Note:    Date:  03/13/2021   ID:  Katelyn Alvarado, DOB [birth year], MRN [id number]  PCP:  Rich Fuchs, PA  Cardiologist:  Berniece Salines, DO  Electrophysiologist:  None   Referring MD: Darreld Mclean, MD   No chief complaint on file. Has been experiencing significant palpitations since I had COVID  History of Present Illness:    Katelyn Alvarado is a 25 y.o. female with a hx of migraine headaches, anxiety, ADHD recent COVID-19 infection is here today to be evaluated for significant fatigue and palpitations.  The patient tells me that she contracted COVID-19 in May 2022 she did not require hospitalization.  But since that time she has been experiencing significant palpitations.  She describes as abrupt onset of fast heartbeat which lasts for sometimes 20 minutes at a time.  She is experiencing episodes about 2-3 times weekly.  Nothing makes it better or worse.  She notices recently that these episodes have turned into whenever she she stands up her heart rate races and sometimes she feels a fall.  She has not passed out but several times has had to lean on her wall to prevent falling. Fatigue is a big deal for her as well.  She is unable to complete activities that she enjoyed especially exercising.  She gets significantly fatigued has to stop. No chest pain, no syncope events.  Past Medical History:  Diagnosis Date   Headache    migaines   History of ear infection    Hx of fracture of arm    left    Hx of varicella    titer confirmed    PPD screening test    negative     Past Surgical History:  Procedure Laterality Date   TYMPANOPLASTY Right 07/15/2016   Procedure: TYMPANOPLASTY;  Surgeon: Izora Gala, MD;  Location: [facility];  Service: ENT;  Laterality: Right;   TYMPANOSTOMY TUBE PLACEMENT     WISDOM TOOTH EXTRACTION      Current Medications: Current Meds  Medication Sig   citalopram (CELEXA) 40 MG tablet Take by mouth.   COVID-19 mRNA vaccine,  Pfizer, 30 MCG/0.3ML injection INJECT AS DIRECTED   hydrOXYzine (ATARAX/VISTARIL) 10 MG tablet TAKE 1 TABLET BY MOUTH 3 TIMES DAILY AS NEEDED FOR ANXIETY   lisdexamfetamine (VYVANSE) 50 MG capsule TAKE ONE CAPSULE (50 MG DOSE) BY MOUTH EVERY MORNING.   norethindrone (MICRONOR) 0.35 MG tablet Take 1 tablet by mouth daily.   propranolol (INDERAL) 10 MG tablet Take 1 tablet (10 mg total) by mouth 2 (two) times daily.     Allergies:   Sumatriptan   Social History   Socioeconomic History   Marital status: Single    Spouse name: Not on file   Number of children: Not on file   Years of education: Not on file   Highest education level: Not on file  Occupational History   Not on file  Tobacco Use   Smoking status: Never   Smokeless tobacco: Never  Substance and Sexual Activity   Alcohol use: No    Alcohol/week: 0.0 standard drinks   Drug use: No   Sexual activity: Not on file  Other Topics Concern   Not on file  Social History Narrative   Neg tad  See   [facility] under centricity    In  [employer] program to do clinical    Social Determinants of Health   Financial Resource Strain: Not on file  Food Insecurity: Not on  file  Transportation Needs: Not on file  Physical Activity: Not on file  Stress: Not on file  Social Connections: Not on file     Family History: The patient's family history includes Asthma in her sister; Hypertension in an other family member.  ROS:   Review of Systems  Constitution: Negative for decreased appetite, fever and weight gain.  HENT: Negative for congestion, ear discharge, hoarse voice and sore throat.   Eyes: Negative for discharge, redness, vision loss in right eye and visual halos.  Cardiovascular: Reports palpitations.  Negative for chest pain, dyspnea on exertion, leg swelling, orthopnea. Respiratory: Negative for cough, hemoptysis, shortness of breath and snoring.   Endocrine: Negative for heat intolerance and polyphagia.  Hematologic/Lymphatic:  Negative for bleeding problem. Does not bruise/bleed easily.  Skin: Negative for flushing, nail changes, rash and suspicious lesions.  Musculoskeletal: Negative for arthritis, joint pain, muscle cramps, myalgias, neck pain and stiffness.  Gastrointestinal: Negative for abdominal pain, bowel incontinence, diarrhea and excessive appetite.  Genitourinary: Negative for decreased libido, genital sores and incomplete emptying.  Neurological: Negative for brief paralysis, focal weakness, headaches and loss of balance.  Psychiatric/Behavioral: Negative for altered mental status, depression and suicidal ideas.  Allergic/Immunologic: Negative for HIV exposure and persistent infections.    EKGs/Labs/Other Studies Reviewed:    The following studies were reviewed today:   EKG:  The ekg ordered today demonstrates sinus rhythm, heart rate 90 bpm, compared to prior EKG done in May 2022 patient no longer in sinus tachycardia.  Chest x-ray done on Feb 13, 2021 FINDINGS: The heart size and mediastinal contours are within normal limits. Both lungs are clear. The visualized skeletal structures are unremarkable.   IMPRESSION: No active cardiopulmonary disease.    Recent Labs: 02/13/2021: ALT 8; BUN 12; Creatinine, Ser 0.75; Hemoglobin 13.5; Platelets 379.0; Potassium 4.1; Sodium 138; TSH 1.29  Recent Lipid Panel    Component Value Date/Time   CHOL 114 05/24/2015 1358   TRIG 205.0 (H) 05/24/2015 1358   HDL 39.50 05/24/2015 1358   CHOLHDL 3 05/24/2015 1358   VLDL 41.0 (H) 05/24/2015 1358   LDLDIRECT 55.0 05/24/2015 1358    Physical Exam:    VS:  BP (!) 146/98 (BP Location: Right Arm)   Pulse 90   Ht 5\' 5"  (1.651 m)   Wt 200 lb (90.7 kg)   SpO2 98%   BMI 33.28 kg/m     Wt Readings from Last 3 Encounters:  03/13/21 200 lb (90.7 kg)  02/13/21 194 lb (88 kg)  02/03/21 194 lb (88 kg)     GEN: Well nourished, well developed in no acute distress HEENT: Normal NECK: No JVD; No carotid  bruits LYMPHATICS: No lymphadenopathy CARDIAC: S1S2 noted,RRR, no murmurs, rubs, gallops RESPIRATORY:  Clear to auscultation without rales, wheezing or rhonchi  ABDOMEN: Soft, non-tender, non-distended, +bowel sounds, no guarding. EXTREMITIES: No edema, No cyanosis, no clubbing MUSCULOSKELETAL:  No deformity  SKIN: Warm and dry NEUROLOGIC:  Alert and oriented x 3, non-focal PSYCHIATRIC:  Normal affect, good insight  ASSESSMENT:    1. Palpitations   2. Fatigue, unspecified type   3. Elevated blood pressure reading   4. Obesity (BMI 30-39.9)   5. History of COVID-19    PLAN:    Her palpitations actually started after her COVID-19 infection and these episodes are now frequent-suspect COVID-19 related autonomic dysfunction may be leading to postural orthostatic tachycardia syndrome.  Would like to do fora monitor on the patient for 7 days to make sure  that there is no other arrhythmias playing a role here.  In the meantime propanolol 10 mg twice a day will be given to the patient as needed which she will start after she takes off the monitor.    In terms of her fatigue and history of COVID-19 infection I will like to get an echocardiogram to assess her LV systolic function and for any other structural abnormalities.  Her blood pressure which was manually performed by me in the office is suggesting stage I hypertension.  With family history of hypertension I have asked the patient to take her blood pressure for the next 2 weeks and share this information with me she does continue to be elevated will consider antihypertensive medication.  She also will reduce her salt intake.  The patient understands the need to lose weight with diet and exercise. We have discussed specific strategies for this.   The patient is in agreement with the above plan. The patient left the office in stable condition.  The patient will follow up in 12 weeks-virtual visit will be okay as well.   Medication  Adjustments/Labs and Tests Ordered: Current medicines are reviewed at length with the patient today.  Concerns regarding medicines are outlined above.  Orders Placed This Encounter  Procedures   VITAMIN D 25 Hydroxy (Vit-D Deficiency, Fractures)   LONG TERM MONITOR (3-14 DAYS)   EKG 12-Lead   ECHOCARDIOGRAM COMPLETE   Meds ordered this encounter  Medications   propranolol (INDERAL) 10 MG tablet    Sig: Take 1 tablet (10 mg total) by mouth 2 (two) times daily.    Dispense:  180 tablet    Refill:  3    Patient Instructions  Medication Instructions:  Your physician has recommended you make the following change in your medication:  START: Propranolol 10 mg take one tablet by mouth twice daily.  *If you need a refill on your cardiac medications before your next appointment, please call your pharmacy*   Lab Work: None If you have labs (blood work) drawn today and your tests are completely normal, you will receive your results only by: [facility] (if you have MyChart) OR A paper copy in the mail If you have any lab test that is abnormal or we need to change your treatment, we will call you to review the results.   Testing/Procedures: Your physician has requested that you have an echocardiogram. Echocardiography is a painless test that uses sound waves to create images of your heart. It provides your doctor with information about the size and shape of your heart and how well your heart's chambers and valves are working. This procedure takes approximately one hour. There are no restrictions for this procedure.  A zio monitor was ordered today. It will remain on for 7 days. You will then return monitor and event diary in provided box. It takes 1-2 weeks for report to be downloaded and returned to [place]. We will call you with the results. If monitor falls off or has orange flashing light, please call [employer] for further instructions.     Follow-Up: At [facility], you and your health  needs are our priority.  As part of our continuing mission to provide you with exceptional heart care, we have created designated Provider Care Teams.  These Care Teams include your primary Cardiologist (physician) and Advanced Practice Providers (APPs -  Physician Assistants and Nurse Practitioners) who all work together to provide you with the care you need, when you need it.  We recommend signing up for the patient portal called "MyChart".  Sign up information is provided on this After Visit Summary.  MyChart is used to connect with patients for Virtual Visits (Telemedicine).  Patients are able to view lab/test results, encounter notes, upcoming appointments, etc.  Non-urgent messages can be sent to your provider as well.   To learn more about what you can do with MyChart, go to [email or web address].    Your next appointment:   12 week(s)  The format for your next appointment:   In Person  Provider:   Shirlee More, MD   Other Instructions    Adopting a Healthy Lifestyle.  Know what a healthy weight is for you (roughly BMI <25) and aim to maintain this   Aim for 7+ servings of fruits and vegetables daily   65-80+ fluid ounces of water or unsweet tea for healthy kidneys   Limit to max 1 drink of alcohol per day; avoid smoking/tobacco   Limit animal fats in diet for cholesterol and heart health - choose grass fed whenever available   Avoid highly processed foods, and foods high in saturated/trans fats   Aim for low stress - take time to unwind and care for your mental health   Aim for 150 min of moderate intensity exercise weekly for heart health, and weights twice weekly for bone health   Aim for 7-9 hours of sleep daily   When it comes to diets, agreement about the perfect plan isnt easy to find, even among the experts. Experts at the [employer] developed an idea known as the Healthy Eating Plate. Just imagine a plate divided into logical, healthy  portions.   The emphasis is on diet quality:   Load up on vegetables and fruits - one-half of your plate: Aim for color and variety, and remember that potatoes dont count.   Go for whole grains - one-quarter of your plate: Whole wheat, barley, wheat berries, quinoa, oats, brown rice, and foods made with them. If you want pasta, go with whole wheat pasta.   Protein power - one-quarter of your plate: Fish, chicken, beans, and nuts are all healthy, versatile protein sources. Limit red meat.   The diet, however, does go beyond the plate, offering a few other suggestions.   Use healthy plant oils, such as olive, canola, soy, corn, sunflower and peanut. Check the labels, and avoid partially hydrogenated oil, which have unhealthy trans fats.   If youre thirsty, drink water. Coffee and tea are good in moderation, but skip sugary drinks and limit milk and dairy products to one or two daily servings.   The type of carbohydrate in the diet is more important than the amount. Some sources of carbohydrates, such as vegetables, fruits, whole grains, and beans-are healthier than others.   Finally, stay active  Signed, Berniece Salines, DO  03/13/2021 8:57 AM    [place]

## 2021-03-14 ENCOUNTER — Ambulatory Visit (HOSPITAL_COMMUNITY): Admission: RE | Admit: 2021-03-14 | Payer: BC Managed Care – PPO | Source: Ambulatory Visit

## 2021-03-14 LAB — VITAMIN D 25 HYDROXY (VIT D DEFICIENCY, FRACTURES): Vit D, 25-Hydroxy: 35.6 ng/mL (ref 30.0–100.0)

## 2021-03-17 ENCOUNTER — Telehealth: Payer: Self-pay | Admitting: *Deleted

## 2021-03-17 ENCOUNTER — Telehealth: Payer: Self-pay

## 2021-03-17 NOTE — Telephone Encounter (Signed)
-----   Message from Berniece Salines, DO sent at 03/14/2021  1:29 PM EDT ----- Vitamin D normal

## 2021-03-17 NOTE — Telephone Encounter (Signed)
Left message on patients voicemail to please return our call.   

## 2021-03-17 NOTE — Telephone Encounter (Signed)
Received email from [employer] that pt will be mailing her monitor back after 2 days due to skin irritation. Will review results when processed and consult with Dr. Harriet Masson.

## 2021-03-18 ENCOUNTER — Encounter: Payer: Self-pay | Admitting: Family Medicine

## 2021-03-18 ENCOUNTER — Ambulatory Visit (INDEPENDENT_AMBULATORY_CARE_PROVIDER_SITE_OTHER): Payer: BC Managed Care – PPO | Admitting: Family Medicine

## 2021-03-18 ENCOUNTER — Other Ambulatory Visit: Payer: Self-pay

## 2021-03-18 ENCOUNTER — Other Ambulatory Visit (HOSPITAL_BASED_OUTPATIENT_CLINIC_OR_DEPARTMENT_OTHER): Payer: Self-pay

## 2021-03-18 VITALS — BP 123/79 | HR 87 | Temp 98.4°F | Ht 65.0 in | Wt 195.0 lb

## 2021-03-18 DIAGNOSIS — R058 Other specified cough: Secondary | ICD-10-CM | POA: Diagnosis not present

## 2021-03-18 MED ORDER — QVAR REDIHALER 40 MCG/ACT IN AERB
2.0000 | INHALATION_SPRAY | Freq: Two times a day (BID) | RESPIRATORY_TRACT | 2 refills | Status: DC
  Filled 2021-03-18: qty 10.6, 30d supply, fill #0

## 2021-03-18 MED ORDER — BENZONATATE 200 MG PO CAPS
200.0000 mg | ORAL_CAPSULE | Freq: Two times a day (BID) | ORAL | 0 refills | Status: DC | PRN
  Filled 2021-03-18: qty 30, 15d supply, fill #0

## 2021-03-18 NOTE — Patient Instructions (Signed)
Consider honey.  Remember to rinse your mouth out after use.  Let us know if you need anything.

## 2021-03-18 NOTE — Progress Notes (Signed)
Chief Complaint  Patient presents with   Cough    Katelyn Alvarado is 25 y.o. and is here for a cough.  Duration: 1 mo Productive? No Associated symptoms: ear fullness Denies: fever, rhinorrhea, sore throat, chest pain, hemoptysis, dyspnea, wheezing Hx of GERD? No ACEi? No Tried Mucinex DM, Elderberry, expired Best boy  Past Medical History:  Diagnosis Date   Headache    migaines   History of ear infection    Hx of fracture of arm    left    Hx of varicella    titer confirmed    PPD screening test    negative    Family History  Problem Relation Age of Onset   Hypertension Other    Asthma Sister     BP 123/79   Pulse 87   Temp 98.4 F (36.9 C) (Oral)   Ht 5\' 5"  (1.651 m)   Wt 195 lb (88.5 kg)   BMI 32.45 kg/m  Gen: Awake, alert, appears stated age HEENT: Ears neg, nares patent without D/C, turbinates unremarkable, Pharynx pink without exudate Neck: Supple, no masses or asymmetry, no tenderness Heart: RRR, no LE edema Lungs: CTAB, normal effort, no accessory muscle use Psych: Age appropriate judgement and insight, normal mood and affect  Post-viral cough syndrome - Plan: benzonatate (TESSALON) 200 MG capsule, beclomethasone (QVAR REDIHALER) 40 MCG/ACT inhaler  Benzonatate prn. Start ICS for residual inflammation in lungs. F/u prn.  The patient voiced understanding and agreement to the plan.  Alice, DO 03/18/21 11:13 AM

## 2021-03-19 ENCOUNTER — Other Ambulatory Visit (HOSPITAL_BASED_OUTPATIENT_CLINIC_OR_DEPARTMENT_OTHER): Payer: Self-pay

## 2021-03-19 ENCOUNTER — Other Ambulatory Visit: Payer: Self-pay | Admitting: Family Medicine

## 2021-03-19 MED ORDER — AMOXICILLIN-POT CLAVULANATE 875-125 MG PO TABS
1.0000 | ORAL_TABLET | Freq: Two times a day (BID) | ORAL | 0 refills | Status: AC
  Filled 2021-03-19: qty 14, 7d supply, fill #0

## 2021-03-25 ENCOUNTER — Other Ambulatory Visit: Payer: Self-pay

## 2021-03-25 ENCOUNTER — Ambulatory Visit (HOSPITAL_BASED_OUTPATIENT_CLINIC_OR_DEPARTMENT_OTHER)
Admission: RE | Admit: 2021-03-25 | Discharge: 2021-03-25 | Disposition: A | Discharge status: Home / Self Care | Payer: BC Managed Care – PPO | Source: Ambulatory Visit | Attending: Family Medicine | Admitting: Family Medicine

## 2021-03-25 DIAGNOSIS — R058 Other specified cough: Secondary | ICD-10-CM | POA: Insufficient documentation

## 2021-03-26 ENCOUNTER — Ambulatory Visit: Payer: BC Managed Care – PPO | Admitting: Psychology

## 2021-03-26 ENCOUNTER — Other Ambulatory Visit (HOSPITAL_BASED_OUTPATIENT_CLINIC_OR_DEPARTMENT_OTHER): Payer: Self-pay

## 2021-03-28 ENCOUNTER — Other Ambulatory Visit (HOSPITAL_BASED_OUTPATIENT_CLINIC_OR_DEPARTMENT_OTHER): Payer: Self-pay

## 2021-03-28 MED ORDER — VYVANSE 50 MG PO CAPS
ORAL_CAPSULE | ORAL | 0 refills | Status: DC
  Filled 2021-03-28: qty 30, 30d supply, fill #0

## 2021-03-28 MED ORDER — NORETHINDRONE 0.35 MG PO TABS
ORAL_TABLET | ORAL | 4 refills | Status: DC
  Filled 2021-03-28: qty 84, 84d supply, fill #0
  Filled 2021-06-29: qty 84, 84d supply, fill #1
  Filled 2021-09-23: qty 84, 84d supply, fill #2
  Filled 2021-12-22 – 2022-01-09 (×2): qty 84, 84d supply, fill #3

## 2021-04-01 ENCOUNTER — Telehealth: Payer: Self-pay | Admitting: Cardiology

## 2021-04-01 ENCOUNTER — Other Ambulatory Visit (HOSPITAL_BASED_OUTPATIENT_CLINIC_OR_DEPARTMENT_OTHER): Payer: Self-pay

## 2021-04-01 NOTE — Telephone Encounter (Signed)
Patient returning call for monitor results. 

## 2021-04-02 NOTE — Telephone Encounter (Signed)
Spoke with patient, see chart.    

## 2021-04-09 ENCOUNTER — Other Ambulatory Visit (HOSPITAL_COMMUNITY): Payer: BC Managed Care – PPO

## 2021-04-09 ENCOUNTER — Ambulatory Visit (HOSPITAL_COMMUNITY): Admission: RE | Admit: 2021-04-09 | Payer: BC Managed Care – PPO | Source: Ambulatory Visit

## 2021-04-09 ENCOUNTER — Ambulatory Visit (INDEPENDENT_AMBULATORY_CARE_PROVIDER_SITE_OTHER): Payer: BC Managed Care – PPO | Admitting: Psychology

## 2021-04-09 DIAGNOSIS — F411 Generalized anxiety disorder: Secondary | ICD-10-CM

## 2021-04-17 ENCOUNTER — Other Ambulatory Visit (HOSPITAL_BASED_OUTPATIENT_CLINIC_OR_DEPARTMENT_OTHER): Payer: Self-pay

## 2021-04-18 ENCOUNTER — Other Ambulatory Visit (HOSPITAL_BASED_OUTPATIENT_CLINIC_OR_DEPARTMENT_OTHER): Payer: Self-pay

## 2021-04-18 MED ORDER — CITALOPRAM HYDROBROMIDE 40 MG PO TABS
ORAL_TABLET | ORAL | 1 refills | Status: DC
  Filled 2021-04-18: qty 90, 90d supply, fill #0

## 2021-05-01 ENCOUNTER — Other Ambulatory Visit (HOSPITAL_BASED_OUTPATIENT_CLINIC_OR_DEPARTMENT_OTHER): Payer: Self-pay

## 2021-05-01 MED ORDER — VYVANSE 50 MG PO CAPS
ORAL_CAPSULE | ORAL | 0 refills | Status: DC
  Filled 2021-05-01: qty 30, 30d supply, fill #0

## 2021-05-07 ENCOUNTER — Ambulatory Visit (INDEPENDENT_AMBULATORY_CARE_PROVIDER_SITE_OTHER): Payer: BC Managed Care – PPO | Admitting: Psychology

## 2021-05-07 DIAGNOSIS — F411 Generalized anxiety disorder: Secondary | ICD-10-CM | POA: Diagnosis not present

## 2021-05-23 ENCOUNTER — Ambulatory Visit: Payer: BC Managed Care – PPO | Admitting: Psychology

## 2021-05-27 ENCOUNTER — Ambulatory Visit (INDEPENDENT_AMBULATORY_CARE_PROVIDER_SITE_OTHER): Payer: BC Managed Care – PPO | Admitting: Psychology

## 2021-05-27 DIAGNOSIS — F411 Generalized anxiety disorder: Secondary | ICD-10-CM | POA: Diagnosis not present

## 2021-06-03 ENCOUNTER — Other Ambulatory Visit (HOSPITAL_BASED_OUTPATIENT_CLINIC_OR_DEPARTMENT_OTHER): Payer: Self-pay

## 2021-06-03 MED ORDER — VYVANSE 50 MG PO CAPS
ORAL_CAPSULE | ORAL | 0 refills | Status: DC
  Filled 2021-06-03: qty 30, 30d supply, fill #0

## 2021-06-04 ENCOUNTER — Ambulatory Visit: Payer: BC Managed Care – PPO | Admitting: Cardiology

## 2021-06-11 ENCOUNTER — Other Ambulatory Visit (HOSPITAL_BASED_OUTPATIENT_CLINIC_OR_DEPARTMENT_OTHER): Payer: Self-pay

## 2021-06-13 ENCOUNTER — Other Ambulatory Visit: Payer: Self-pay

## 2021-06-13 ENCOUNTER — Ambulatory Visit (HOSPITAL_BASED_OUTPATIENT_CLINIC_OR_DEPARTMENT_OTHER)
Admission: RE | Admit: 2021-06-13 | Discharge: 2021-06-13 | Disposition: A | Discharge status: Home / Self Care | Payer: BC Managed Care – PPO | Source: Ambulatory Visit | Attending: Cardiology | Admitting: Cardiology

## 2021-06-13 DIAGNOSIS — R002 Palpitations: Secondary | ICD-10-CM | POA: Diagnosis present

## 2021-06-13 LAB — ECHOCARDIOGRAM COMPLETE
AR max vel: 2.56 cm2
AV Area VTI: 2.63 cm2
AV Area mean vel: 2.21 cm2
AV Mean grad: 4 mmHg
AV Peak grad: 6.7 mmHg
Ao pk vel: 1.29 m/s
Area-P 1/2: 4.26 cm2
Calc EF: 57.2 %
S' Lateral: 3.13 cm
Single Plane A2C EF: 59.2 %
Single Plane A4C EF: 54.6 %

## 2021-06-19 ENCOUNTER — Ambulatory Visit (INDEPENDENT_AMBULATORY_CARE_PROVIDER_SITE_OTHER): Payer: BC Managed Care – PPO | Admitting: Psychology

## 2021-06-19 DIAGNOSIS — F411 Generalized anxiety disorder: Secondary | ICD-10-CM

## 2021-06-24 ENCOUNTER — Ambulatory Visit: Payer: BC Managed Care – PPO | Admitting: Psychology

## 2021-06-30 ENCOUNTER — Other Ambulatory Visit (HOSPITAL_BASED_OUTPATIENT_CLINIC_OR_DEPARTMENT_OTHER): Payer: Self-pay

## 2021-07-09 ENCOUNTER — Other Ambulatory Visit (HOSPITAL_BASED_OUTPATIENT_CLINIC_OR_DEPARTMENT_OTHER): Payer: Self-pay

## 2021-07-09 MED ORDER — CITALOPRAM HYDROBROMIDE 40 MG PO TABS
40.0000 mg | ORAL_TABLET | Freq: Every day | ORAL | 1 refills | Status: AC
  Filled 2021-07-09: qty 90, 90d supply, fill #0

## 2021-07-09 MED ORDER — AMPHETAMINE-DEXTROAMPHETAMINE 10 MG PO TABS
10.0000 mg | ORAL_TABLET | Freq: Every day | ORAL | 0 refills | Status: AC
  Filled 2021-07-09: qty 30, 30d supply, fill #0

## 2021-07-09 MED ORDER — VYVANSE 60 MG PO CAPS
60.0000 mg | ORAL_CAPSULE | Freq: Every morning | ORAL | 0 refills | Status: AC
  Filled 2021-07-09: qty 30, 30d supply, fill #0

## 2021-08-04 ENCOUNTER — Ambulatory Visit (INDEPENDENT_AMBULATORY_CARE_PROVIDER_SITE_OTHER): Payer: BC Managed Care – PPO | Admitting: Psychology

## 2021-08-04 DIAGNOSIS — F411 Generalized anxiety disorder: Secondary | ICD-10-CM

## 2021-08-28 ENCOUNTER — Ambulatory Visit: Payer: BC Managed Care – PPO | Admitting: Cardiology

## 2021-08-28 ENCOUNTER — Other Ambulatory Visit: Payer: Self-pay

## 2021-08-28 MED ORDER — PROPRANOLOL HCL 10 MG PO TABS: 10.0000 mg | ORAL_TABLET | Freq: Two times a day (BID) | ORAL | 0 refills | Status: AC

## 2021-08-28 NOTE — Progress Notes (Signed)
Refill sent to pharmacy.   

## 2021-09-08 ENCOUNTER — Ambulatory Visit: Payer: BC Managed Care – PPO | Admitting: Cardiology

## 2021-09-18 ENCOUNTER — Ambulatory Visit (INDEPENDENT_AMBULATORY_CARE_PROVIDER_SITE_OTHER): Payer: BC Managed Care – PPO | Admitting: Psychology

## 2021-09-18 DIAGNOSIS — F411 Generalized anxiety disorder: Secondary | ICD-10-CM | POA: Diagnosis not present

## 2021-09-18 NOTE — Progress Notes (Signed)
[facility] Counselor/Therapist Progress Note  Patient ID: Katelyn Alvarado, MRN: [id number],    Date: 09/18/2021  Time Spent: 9:00am-9:45am   45 minutes   Treatment Type: Individual Therapy  Reported Symptoms: anxiety  Mental Status Exam: Appearance:  Casual     Behavior: Appropriate  Motor: Normal  Speech/Language:  Normal Rate  Affect: Appropriate  Mood: normal  Thought process: normal  Thought content:   WNL  Sensory/Perceptual disturbances:   WNL  Orientation: oriented to person, place, time/date, and situation  Attention: Good  Concentration: Good  Memory: WNL  Fund of knowledge:  Good  Insight:   Good  Judgment:  Good  Impulse Control: Good   Risk Assessment: Danger to Self:  No Self-injurious Behavior: No Danger to Others: No Duty to Warn:no Physical Aggression / Violence:No  Access to Firearms a concern: No  Gang Involvement:No   Subjective: Pt present for face-to-face individual therapy via video Webex.  Pt consents to telehealth video session due to COVID 19 pandemic.   Location of pt: home Location of therapist: home office.   Pt talked about her new job.  She has been working nights.  She has adjusted well to working nights and is able to sleep well during the day.  She states the job is going very well.   Last night was pt's first night working on her own and off orientation.  She was anxious but did well and was able to control her anxiety.   Pt talked about adopting a new dog.  She has two dogs now and is happy with her pet companions.   Pt talked about her relationship with her fiance.  He is working locally now and they are doing well living together.  They have been communicating well about lifestyle differences.   Pt states overall she has been doing well.    Worked on self care strategies. Provided supportive counseling.     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  (Treatment Plan Target Date: 03/12/2022) Pt is progressing toward treatment goals.   Plan to continue to see pt monthly.    Shiven Junious, LCSW

## 2021-09-23 ENCOUNTER — Other Ambulatory Visit (HOSPITAL_BASED_OUTPATIENT_CLINIC_OR_DEPARTMENT_OTHER): Payer: Self-pay

## 2021-11-06 ENCOUNTER — Telehealth: Payer: Self-pay | Admitting: Family Medicine

## 2021-11-06 MED ORDER — PENICILLIN V POTASSIUM 500 MG PO TABS: 500.0000 mg | ORAL_TABLET | Freq: Two times a day (BID) | ORAL | 0 refills | Status: DC

## 2021-11-06 NOTE — Telephone Encounter (Signed)
Received phone call from patient.  She has tonsillitis which began yesterday.  She sent me a good-quality photos, she has bilateral exudative tonsillitis.  Temperature yesterday to 99, afebrile today.  She has tested for COVID, negative.  She had the flu last month  She did take 2 days of Augmentin that she had on hand today. History of strep pharyngitis We will prescribe penicillin 500 twice daily for 10 days.  I counseled her if not improving rapidly, within 1 to 2 days she may have mono or some other etiology.  She will seek care if not responding quickly

## 2021-11-07 ENCOUNTER — Other Ambulatory Visit (HOSPITAL_BASED_OUTPATIENT_CLINIC_OR_DEPARTMENT_OTHER): Payer: Self-pay

## 2021-11-07 MED ORDER — AMOXICILLIN 500 MG PO CAPS
ORAL_CAPSULE | ORAL | 0 refills | Status: DC
  Filled 2021-11-07: qty 20, 10d supply, fill #0

## 2021-11-20 ENCOUNTER — Ambulatory Visit: Payer: BC Managed Care – PPO | Admitting: Psychology

## 2021-11-27 ENCOUNTER — Telehealth: Payer: BC Managed Care – PPO | Admitting: Family Medicine

## 2021-11-27 DIAGNOSIS — R300 Dysuria: Secondary | ICD-10-CM | POA: Diagnosis not present

## 2021-11-27 MED ORDER — NITROFURANTOIN MONOHYD MACRO 100 MG PO CAPS: 100.0000 mg | ORAL_CAPSULE | Freq: Two times a day (BID) | ORAL | 0 refills | Status: AC

## 2021-11-27 NOTE — Progress Notes (Signed)

## 2021-12-22 ENCOUNTER — Other Ambulatory Visit (HOSPITAL_BASED_OUTPATIENT_CLINIC_OR_DEPARTMENT_OTHER): Payer: Self-pay

## 2021-12-29 ENCOUNTER — Other Ambulatory Visit (HOSPITAL_BASED_OUTPATIENT_CLINIC_OR_DEPARTMENT_OTHER): Payer: Self-pay

## 2022-01-09 ENCOUNTER — Other Ambulatory Visit (HOSPITAL_BASED_OUTPATIENT_CLINIC_OR_DEPARTMENT_OTHER): Payer: Self-pay

## 2022-03-09 NOTE — Addendum Note (Signed)
Encounter addended by: Annie Paras on: 03/09/2022 4:29 PM  Actions taken: Letter saved

## 2022-03-11 ENCOUNTER — Encounter: Payer: Self-pay | Admitting: Family Medicine

## 2022-03-11 ENCOUNTER — Ambulatory Visit: Payer: BC Managed Care – PPO | Admitting: Family Medicine

## 2022-03-11 VITALS — BP 108/70 | HR 103 | Temp 99.0°F | Ht 65.0 in | Wt 198.1 lb

## 2022-03-11 DIAGNOSIS — D489 Neoplasm of uncertain behavior, unspecified: Secondary | ICD-10-CM

## 2022-03-11 DIAGNOSIS — L859 Epidermal thickening, unspecified: Secondary | ICD-10-CM

## 2022-03-11 NOTE — Progress Notes (Signed)
Chief Complaint  Patient presents with   Procedure    Katelyn Alvarado is a 26 y.o. female here for a skin complaint.  Duration: a few years Location: R upper arm Pruritic? No Painful? Will get irritated Drainage? No New soaps/lotions/topicals/detergents? No Other associated symptoms: might growing slowly Therapies tried thus far: none  Past Medical History:  Diagnosis Date   Headache    migaines   History of ear infection    Hx of fracture of arm    left    Hx of varicella    titer confirmed    PPD screening test    negative     BP 108/70   Pulse (!) 103   Temp 99 F (37.2 C) (Oral)   Ht '5\' 5"'$  (1.651 m)   Wt 198 lb 2 oz (89.9 kg)   SpO2 99%   BMI 32.97 kg/m  Gen: awake, alert, appearing stated age Lungs: No accessory muscle use Skin: 0.5 x 0.7 cm lesion on RUE. No drainage, erythema, TTP, fluctuance, excoriation Psych: Age appropriate judgment and insight  Procedure note; shave biopsy Informed consent was obtained. The area was cleaned with alcohol and injected with 1 mL of 1% lidocaine with epinephrine. A Dermablade was slightly bent and used to cut under the area of interest. The specimen was placed in a sterile specimen cup and sent to the lab. The area was then cauterized ensuring adequate hemostasis. The area was dressed with triple antibiotic ointment and a bandage. There were no complications noted. The patient tolerated the procedure well.  Neoplasm of uncertain behavior - Plan: PR SHAV SKIN LES 0.6-1.0 CM TRUNK,ARM,LEG  Aftercare instructions verbalized and written down. Warning signs and symptoms verbalized and written down in AVS. Give 1 week to get biopsy back.  F/u prn. The patient voiced understanding and agreement to the plan.  [place], DO 03/11/22 4:26 PM

## 2022-03-11 NOTE — Patient Instructions (Signed)
Do not shower for the rest of the day. When you do wash it, use only soap and water. Do not vigorously scrub. Apply triple antibiotic ointment (like Neosporin) twice daily. Keep the area clean and dry.   Things to look out for: increasing pain not relieved by ibuprofen/acetaminophen, fevers, spreading redness, drainage of pus, or foul odor.  Give us 1 week to get the results of your biopsy back.  Let us know if you need anything. 

## 2022-08-30 IMAGING — CT CT VENOGRAM HEAD
4 of 11 series · 16 of 47 positions shown · IV contrast (APPLIED)
Comparison: None available.

CLINICAL DATA: Initial evaluation for acute headache, dural venous
sinus thrombosis suspected.

EXAM:
CT VENOGRAM HEAD
TECHNIQUE: Dedicated CT venogram images of the brain prior to and following the
administration of IV contrast were obtained using the standard
protocol.
CONTRAST:  100mL OMNIPAQUE IOHEXOL 300 MG/ML  SOLN

[Series 7: venogram head · axial · 0.41mm/px · z∈[-132,-36]mm · 5 of 72 slices shown]
[im 12/72  brain]
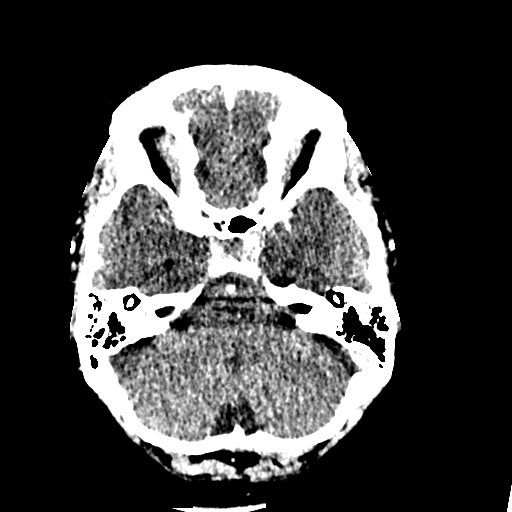
[im 24/72  bone]
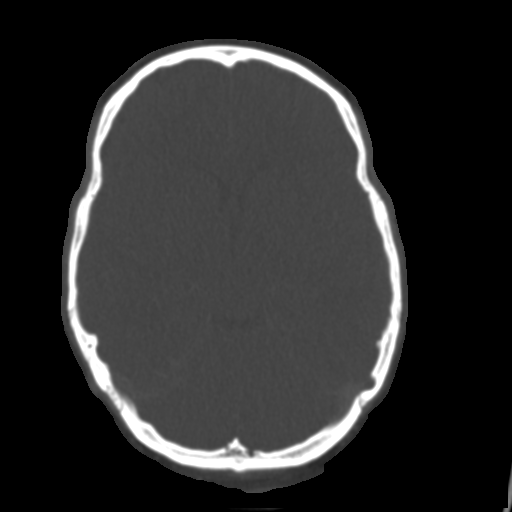
[im 36/72  brain]
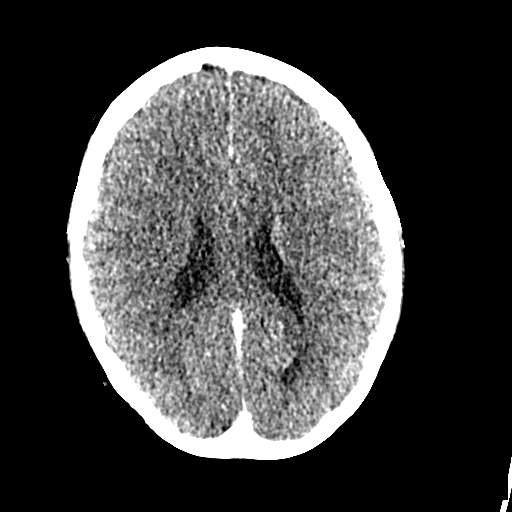
[im 48/72  bone]
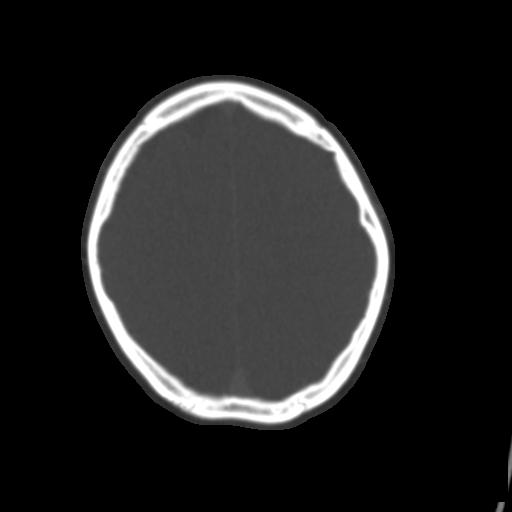
[im 60/72  brain]
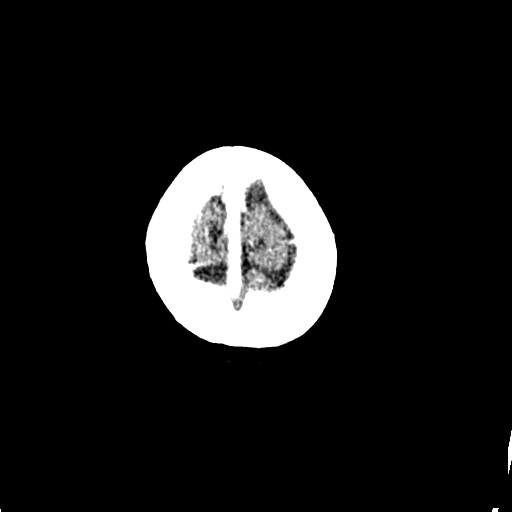

[Series 8: ax thin · axial · 0.29mm/px · z∈[-148,-85]mm · 5 of 141 slices shown]
[im 11/141  brain]
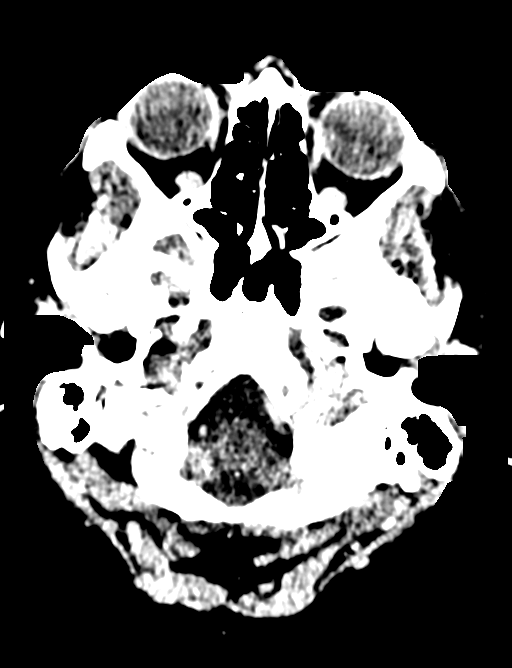
[im 33/141  brain]
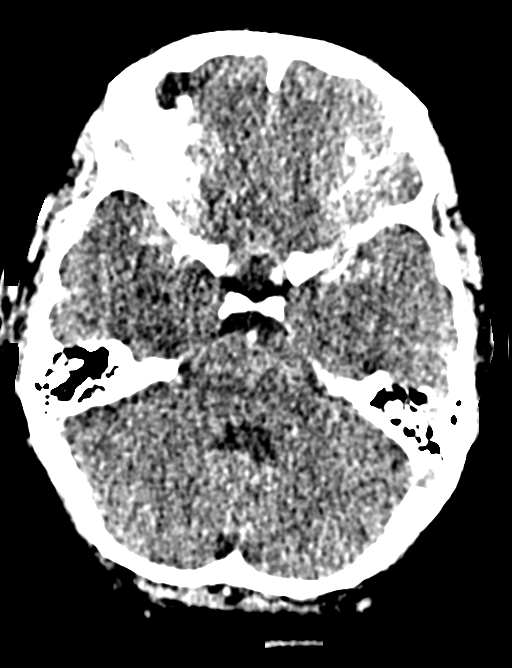
[im 44/141  brain]
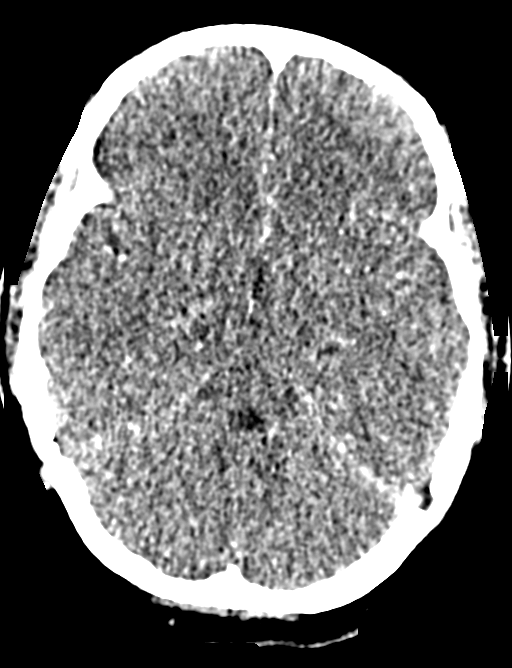
[im 65/141  brain]
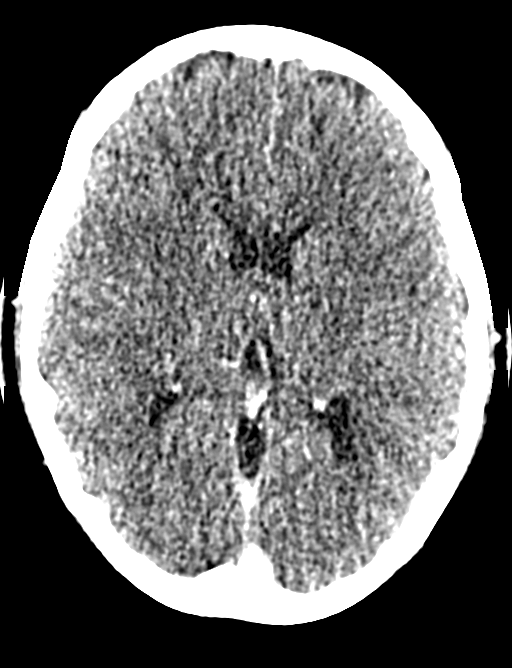
[im 76/141  brain]
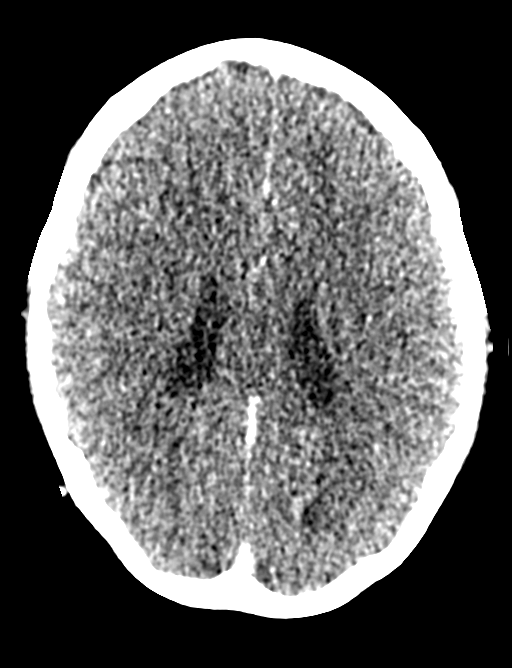

[Series 9: cor thin · coronal · 0.28mm/px · 3 of 198 slices shown]
[im 66/198  brain]
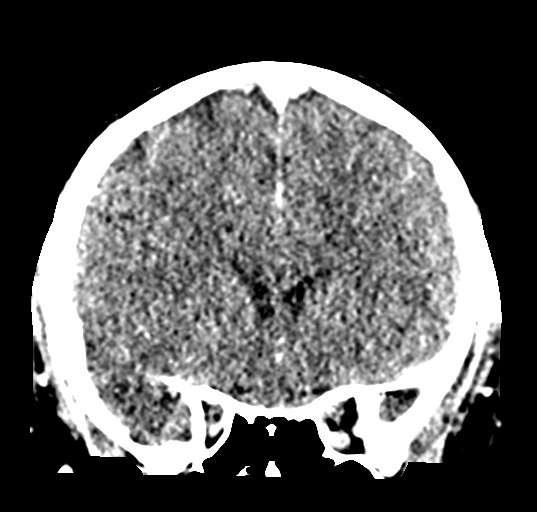
[im 99/198  brain]
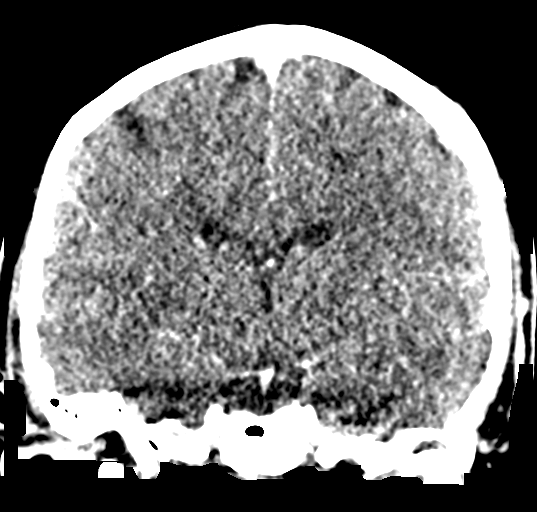
[im 132/198  brain]
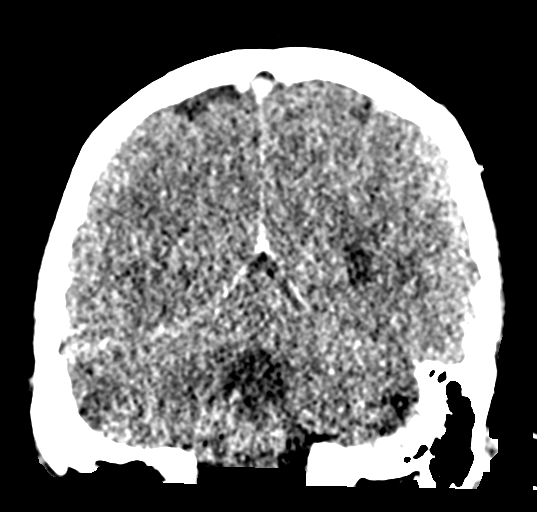

[Series 10: sag thin · sagittal · 0.28mm/px · 3 of 152 slices shown]
[im 38/152  brain]
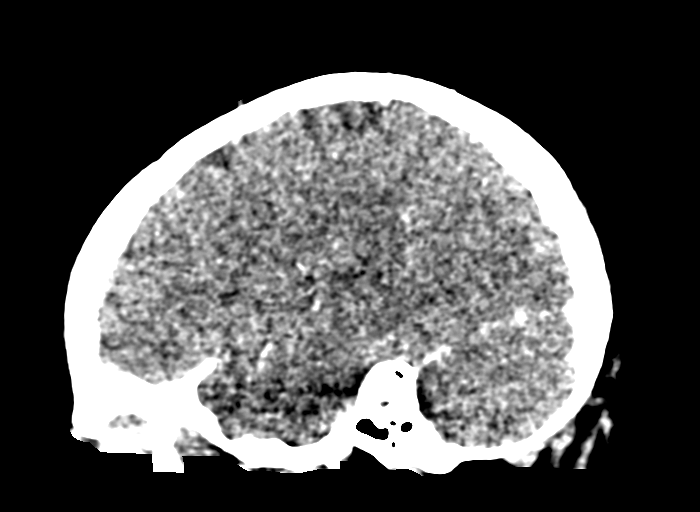
[im 76/152  brain]
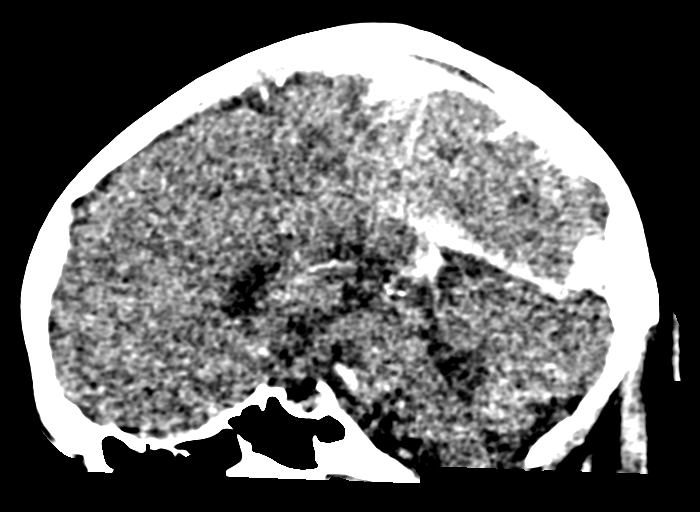
[im 114/152  brain]
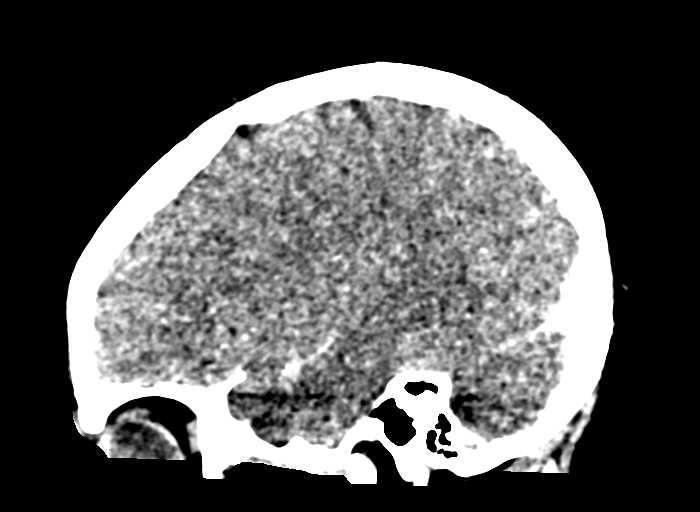

[16 of 47 positions shown; findings below may reference images not displayed]

FINDINGS: Brain: Cerebral volume within normal limits for patient age.

No evidence for acute intracranial hemorrhage. No findings to
suggest acute large vessel territory infarct. No mass lesion,
midline shift, or mass effect. Ventricles are normal in size without
evidence for hydrocephalus. No extra-axial fluid collection
identified.

Vascular: No hyperdense vessel seen prior to contrast
administration. Following contrast administration, normal
enhancement seen throughout the superior sagittal sinus to the
torcula. Left transverse and sigmoid sinuses are patent as is the
visualized proximal left internal jugular vein. Right transverse and
sigmoid sinuses are markedly hypoplastic but grossly patent as well.
Straight sinus, vein of [place], and internal cerebral veins are
patent. No findings to suggest cavernous sinus thrombosis. No dural
sinus thrombosis. No other visible pathologic enhancement within the
brain.

Skull: Scalp soft tissues demonstrate no acute abnormality.
Calvarium intact.

Sinuses/Orbits: Globes and orbital soft tissues within normal
limits.

Visualized paranasal sinuses are clear. No mastoid effusion.
IMPRESSION: 1. Negative CT venogram. No evidence for dural sinus thrombosis.
2. No other acute intracranial abnormality.

## 2022-09-09 ENCOUNTER — Telehealth: Payer: BC Managed Care – PPO | Admitting: Family Medicine

## 2022-09-09 DIAGNOSIS — Z20828 Contact with and (suspected) exposure to other viral communicable diseases: Secondary | ICD-10-CM | POA: Diagnosis not present

## 2022-09-10 MED ORDER — OSELTAMIVIR PHOSPHATE 75 MG PO CAPS: 75.0000 mg | ORAL_CAPSULE | Freq: Two times a day (BID) | ORAL | 0 refills | Status: AC

## 2022-09-10 NOTE — Progress Notes (Signed)
E visit for Flu like symptoms   We are sorry that you are not feeling well.  Here is how we plan to help! Based on what you have shared with me it looks like you may have possible exposure to a virus that causes influenza.  Influenza or "the flu" is   an infection caused by a respiratory virus. The flu virus is highly contagious and persons who did not receive their yearly flu vaccination may "catch" the flu from close contact.  We have anti-viral medications to treat the viruses that cause this infection. They are not a "cure" and only shorten the course of the infection. These prescriptions are most effective when they are given within the first 2 days of "flu" symptoms. Antiviral medication are indicated if you have a high risk of complications from the flu. You should  also consider an antiviral medication if you are in close contact with someone who is at risk. These medications can help patients avoid complications from the flu  but have side effects that you should know. Possible side effects from Tamiflu or oseltamivir include nausea, vomiting, diarrhea, dizziness, headaches, eye redness, sleep problems or other respiratory symptoms. You should not take Tamiflu if you have an allergy to oseltamivir or any to the ingredients in Tamiflu.  Based upon your symptoms and potential risk factors I have prescribed Oseltamivir (Tamiflu).  It has been sent to your designated pharmacy.  You will take one 75 mg capsule orally twice a day for the next 5 days.  ANYONE WHO HAS FLU SYMPTOMS SHOULD: Stay home. The flu is highly contagious and going out or to work exposes others! Be sure to drink plenty of fluids. Water is fine as well as fruit juices, sodas and electrolyte beverages. You may want to stay away from caffeine or alcohol. If you are nauseated, try taking small sips of liquids. How do you know if you are getting enough fluid? Your urine should be a pale yellow or almost colorless. Get rest. Taking  a steamy shower or using a humidifier may help nasal congestion and ease sore throat pain. Using a saline nasal spray works much the same way. Cough drops, hard candies and sore throat lozenges may ease your cough. Line up a caregiver. Have someone check on you regularly.   GET HELP RIGHT AWAY IF: You cannot keep down liquids or your medications. You become short of breath Your fell like you are going to pass out or loose consciousness. Your symptoms persist after you have completed your treatment plan MAKE SURE YOU  Understand these instructions. Will watch your condition. Will get help right away if you are not doing well or get worse.  Your e-visit answers were reviewed by a board certified advanced clinical practitioner to complete your personal care plan.  Depending on the condition, your plan could have included both over the counter or prescription medications.  If there is a problem please reply  once you have received a response from your provider.  Your safety is important to us.  If you have drug allergies check your prescription carefully.    You can use MyChart to ask questions about today's visit, request a non-urgent call back, or ask for a work or school excuse for 24 hours related to this e-Visit. If it has been greater than 24 hours you will need to follow up with your provider, or enter a new e-Visit to address those concerns.  You will get an e-mail in the   next two days asking about your experience.  I hope that your e-visit has been valuable and will speed your recovery. Thank you for using e-visits.    I provided 5 minutes of non face-to-face time during this encounter for chart review, medication and order placement, as well as and documentation.   

## 2024-08-03 ENCOUNTER — Telehealth: Payer: Self-pay | Admitting: Physician Assistant

## 2024-08-03 DIAGNOSIS — R3989 Other symptoms and signs involving the genitourinary system: Secondary | ICD-10-CM

## 2024-08-03 MED ORDER — NITROFURANTOIN MONOHYD MACRO 100 MG PO CAPS: 100.0000 mg | ORAL_CAPSULE | Freq: Two times a day (BID) | ORAL | 0 refills | Status: AC

## 2024-08-03 NOTE — Progress Notes (Signed)
